# Patient Record
Sex: Female | Born: 1955 | Race: Black or African American | Hispanic: No | Marital: Married | Smoking: Never smoker
Health system: Southern US, Community
[De-identification: ages and names within clinical notes are randomized; demographics above are authoritative.]

## PROBLEM LIST (undated history)

## (undated) DIAGNOSIS — K8020 Calculus of gallbladder without cholecystitis without obstruction: Secondary | ICD-10-CM

## (undated) DIAGNOSIS — M1990 Unspecified osteoarthritis, unspecified site: Secondary | ICD-10-CM

## (undated) DIAGNOSIS — I10 Essential (primary) hypertension: Secondary | ICD-10-CM

## (undated) DIAGNOSIS — IMO0002 Reserved for concepts with insufficient information to code with codable children: Secondary | ICD-10-CM

## (undated) DIAGNOSIS — T7840XA Allergy, unspecified, initial encounter: Secondary | ICD-10-CM

## (undated) HISTORY — DX: Allergy, unspecified, initial encounter: T78.40XA

## (undated) HISTORY — PX: TUBAL LIGATION: SHX77

## (undated) HISTORY — DX: Reserved for concepts with insufficient information to code with codable children: IMO0002

## (undated) HISTORY — DX: Calculus of gallbladder without cholecystitis without obstruction: K80.20

## (undated) HISTORY — DX: Unspecified osteoarthritis, unspecified site: M19.90

---

## 1998-10-12 ENCOUNTER — Ambulatory Visit (HOSPITAL_COMMUNITY): Admission: RE | Admit: 1998-10-12 | Discharge: 1998-10-12 | Payer: Self-pay | Admitting: *Deleted

## 1998-10-12 ENCOUNTER — Encounter: Payer: Self-pay | Admitting: *Deleted

## 2010-03-07 ENCOUNTER — Other Ambulatory Visit: Admission: RE | Admit: 2010-03-07 | Discharge: 2010-03-07 | Payer: Self-pay | Admitting: Obstetrics and Gynecology

## 2010-08-23 ENCOUNTER — Emergency Department (HOSPITAL_COMMUNITY)
Admission: EM | Admit: 2010-08-23 | Discharge: 2010-08-23 | Disposition: A | Discharge status: Home / Self Care | Payer: 59 | Attending: Emergency Medicine | Admitting: Emergency Medicine

## 2010-08-23 DIAGNOSIS — R21 Rash and other nonspecific skin eruption: Secondary | ICD-10-CM | POA: Insufficient documentation

## 2010-08-23 DIAGNOSIS — T7840XA Allergy, unspecified, initial encounter: Secondary | ICD-10-CM | POA: Insufficient documentation

## 2010-08-23 DIAGNOSIS — I10 Essential (primary) hypertension: Secondary | ICD-10-CM | POA: Insufficient documentation

## 2010-08-23 LAB — URINALYSIS, ROUTINE W REFLEX MICROSCOPIC
Bilirubin Urine: NEGATIVE
Glucose, UA: NEGATIVE mg/dL
Ketones, ur: NEGATIVE mg/dL
Nitrite: NEGATIVE
Protein, ur: NEGATIVE mg/dL
Specific Gravity, Urine: 1.016 (ref 1.005–1.030)
Urobilinogen, UA: 0.2 mg/dL (ref 0.0–1.0)
pH: 6 (ref 5.0–8.0)

## 2010-08-23 LAB — URINE MICROSCOPIC-ADD ON

## 2010-10-26 ENCOUNTER — Ambulatory Visit (HOSPITAL_BASED_OUTPATIENT_CLINIC_OR_DEPARTMENT_OTHER): Payer: 59

## 2011-08-07 ENCOUNTER — Encounter (INDEPENDENT_AMBULATORY_CARE_PROVIDER_SITE_OTHER): Payer: 59 | Admitting: Obstetrics and Gynecology

## 2011-08-07 DIAGNOSIS — D259 Leiomyoma of uterus, unspecified: Secondary | ICD-10-CM

## 2011-08-07 DIAGNOSIS — R109 Unspecified abdominal pain: Secondary | ICD-10-CM

## 2011-08-15 ENCOUNTER — Encounter (INDEPENDENT_AMBULATORY_CARE_PROVIDER_SITE_OTHER): Payer: 59 | Admitting: Obstetrics and Gynecology

## 2011-08-15 ENCOUNTER — Other Ambulatory Visit (INDEPENDENT_AMBULATORY_CARE_PROVIDER_SITE_OTHER): Payer: 59

## 2011-08-15 DIAGNOSIS — N949 Unspecified condition associated with female genital organs and menstrual cycle: Secondary | ICD-10-CM

## 2011-08-15 DIAGNOSIS — D259 Leiomyoma of uterus, unspecified: Secondary | ICD-10-CM

## 2012-03-05 ENCOUNTER — Telehealth: Payer: Self-pay | Admitting: Family Medicine

## 2012-03-05 NOTE — Telephone Encounter (Signed)
Answering service call.  Identified as usual pt. of Dr. Cleta Alberts.  Complaint: chest pain. Jenna Ashley is a 56 y.o. female  Primary care: Dr. Cyndia Diver. Had visit there last week.  Didn't call their office tonight.  Having chest pain  - center of chest. Started 2 nights ago after eating.  Eased off that night.  Nausea yesterday, but no chest pain.  Yesterday evening felt ok.  Woke up with chest pain at 3am today with center of chest pain again, no dyspnea, no diaphoresis.  Had vomiting 3 times this morning. Center of chest, no radiation.  Chest pain persisted.  hx of IBS - diarrhea few times this morning.  Similar symptoms with gas pains this summer. No known hx CAD, MI, hyperlipidemia.  Discussed possible cause of reflux/gi source of pain with previous hx and current symptoms, but cannot exclude cardiac or other cause over the phone.  Advised to go to ER now for evaluation.  Husband at home and can take her. Understanding expressed.

## 2012-07-07 ENCOUNTER — Encounter (HOSPITAL_COMMUNITY): Payer: Self-pay | Admitting: Physical Medicine and Rehabilitation

## 2012-07-07 ENCOUNTER — Emergency Department (HOSPITAL_COMMUNITY)
Admission: EM | Admit: 2012-07-07 | Discharge: 2012-07-07 | Disposition: A | Discharge status: Home / Self Care | Payer: 59 | Attending: Emergency Medicine | Admitting: Emergency Medicine

## 2012-07-07 DIAGNOSIS — R110 Nausea: Secondary | ICD-10-CM | POA: Insufficient documentation

## 2012-07-07 DIAGNOSIS — E876 Hypokalemia: Secondary | ICD-10-CM

## 2012-07-07 DIAGNOSIS — R630 Anorexia: Secondary | ICD-10-CM | POA: Insufficient documentation

## 2012-07-07 DIAGNOSIS — R259 Unspecified abnormal involuntary movements: Secondary | ICD-10-CM | POA: Insufficient documentation

## 2012-07-07 DIAGNOSIS — R42 Dizziness and giddiness: Secondary | ICD-10-CM | POA: Insufficient documentation

## 2012-07-07 DIAGNOSIS — R450 Nervousness: Secondary | ICD-10-CM | POA: Insufficient documentation

## 2012-07-07 DIAGNOSIS — R109 Unspecified abdominal pain: Secondary | ICD-10-CM | POA: Insufficient documentation

## 2012-07-07 DIAGNOSIS — I10 Essential (primary) hypertension: Secondary | ICD-10-CM | POA: Insufficient documentation

## 2012-07-07 DIAGNOSIS — R5381 Other malaise: Secondary | ICD-10-CM | POA: Insufficient documentation

## 2012-07-07 DIAGNOSIS — Z79899 Other long term (current) drug therapy: Secondary | ICD-10-CM | POA: Insufficient documentation

## 2012-07-07 DIAGNOSIS — Z8719 Personal history of other diseases of the digestive system: Secondary | ICD-10-CM | POA: Insufficient documentation

## 2012-07-07 HISTORY — DX: Essential (primary) hypertension: I10

## 2012-07-07 HISTORY — DX: Calculus of gallbladder without cholecystitis without obstruction: K80.20

## 2012-07-07 LAB — URINALYSIS, ROUTINE W REFLEX MICROSCOPIC
Bilirubin Urine: NEGATIVE
Glucose, UA: NEGATIVE mg/dL
Hgb urine dipstick: NEGATIVE
Ketones, ur: NEGATIVE mg/dL
Leukocytes, UA: NEGATIVE
Nitrite: NEGATIVE
Protein, ur: NEGATIVE mg/dL
Specific Gravity, Urine: 1.007 (ref 1.005–1.030)
Urobilinogen, UA: 0.2 mg/dL (ref 0.0–1.0)
pH: 6.5 (ref 5.0–8.0)

## 2012-07-07 LAB — CBC WITH DIFFERENTIAL/PLATELET
Basophils Absolute: 0 10*3/uL (ref 0.0–0.1)
Basophils Relative: 0 % (ref 0–1)
Eosinophils Absolute: 0.3 10*3/uL (ref 0.0–0.7)
Eosinophils Relative: 4 % (ref 0–5)
HCT: 39.3 % (ref 36.0–46.0)
Hemoglobin: 12.9 g/dL (ref 12.0–15.0)
Lymphocytes Relative: 36 % (ref 12–46)
Lymphs Abs: 2.7 10*3/uL (ref 0.7–4.0)
MCH: 25.6 pg — ABNORMAL LOW (ref 26.0–34.0)
MCHC: 32.8 g/dL (ref 30.0–36.0)
MCV: 78 fL (ref 78.0–100.0)
Monocytes Absolute: 0.3 10*3/uL (ref 0.1–1.0)
Monocytes Relative: 5 % (ref 3–12)
Neutro Abs: 4.1 10*3/uL (ref 1.7–7.7)
Neutrophils Relative %: 55 % (ref 43–77)
Platelets: 310 10*3/uL (ref 150–400)
RBC: 5.04 MIL/uL (ref 3.87–5.11)
RDW: 15.6 % — ABNORMAL HIGH (ref 11.5–15.5)
WBC: 7.4 10*3/uL (ref 4.0–10.5)

## 2012-07-07 LAB — COMPREHENSIVE METABOLIC PANEL
ALT: 12 U/L (ref 0–35)
BUN: 9 mg/dL (ref 6–23)
Calcium: 10.1 mg/dL (ref 8.4–10.5)
GFR calc Af Amer: 78 mL/min — ABNORMAL LOW (ref >90)
Glucose, Bld: 93 mg/dL (ref 70–99)
Sodium: 138 mEq/L (ref 135–145)
Total Protein: 8.5 g/dL — ABNORMAL HIGH (ref 6.0–8.3)

## 2012-07-07 LAB — POCT I-STAT TROPONIN I: Troponin i, poc: 0 ng/mL (ref 0.00–0.08)

## 2012-07-07 MED ORDER — MECLIZINE HCL 50 MG PO TABS: 25.0000 mg | ORAL_TABLET | Freq: Three times a day (TID) | ORAL | Status: DC | PRN

## 2012-07-07 MED ORDER — POTASSIUM CHLORIDE CRYS ER 20 MEQ PO TBCR
40.0000 meq | EXTENDED_RELEASE_TABLET | Freq: Once | ORAL | Status: AC
  Administered 2012-07-07: 40 meq via ORAL
  Filled 2012-07-07: qty 2

## 2012-07-07 MED ORDER — ONDANSETRON HCL 4 MG/2ML IJ SOLN
4.0000 mg | Freq: Once | INTRAMUSCULAR | Status: AC
  Administered 2012-07-07: 4 mg via INTRAVENOUS
  Filled 2012-07-07: qty 2

## 2012-07-07 MED ORDER — SODIUM CHLORIDE 0.9 % IV SOLN
1000.0000 mL | Freq: Once | INTRAVENOUS | Status: AC
  Administered 2012-07-07: 1000 mL via INTRAVENOUS

## 2012-07-07 MED ORDER — SODIUM CHLORIDE 0.9 % IV SOLN: 1000.0000 mL | INTRAVENOUS | Status: DC

## 2012-07-07 MED ORDER — ONDANSETRON 8 MG PO TBDP: 8.0000 mg | ORAL_TABLET | Freq: Three times a day (TID) | ORAL | Status: DC | PRN

## 2012-07-07 NOTE — ED Notes (Signed)
Pt presents to department for evaluation of dizziness, L sided abdominal pain and generalized weakness. Ongoing x1 week. States "I feel like I'm going to pass out." pt is conscious alert and oriented x4. Able to move all extremities. 5/10 pain at the time.

## 2012-07-07 NOTE — ED Provider Notes (Signed)
History    CSN: [id number] Arrival date & time 07/07/12  1048  First MD Initiated Contact with Patient 07/07/12 1112      Chief Complaint  Patient presents with  . Nausea  . Dizziness       Patient is a 57 y.o. female presenting with neurologic complaint. The history is provided by the patient.  Neurologic Problem The primary symptoms include dizziness and nausea. Primary symptoms do not include vomiting. Episode onset: several days ago. The symptoms are unchanged. The neurological symptoms are diffuse. Context: she feels shaky and nervous.   She describes the dizziness as lightheadedness and faintness. Dizziness also occurs with nausea and weakness. Dizziness does not occur with blurred vision, tinnitus, hearing loss, vomiting or diaphoresis.  Additional symptoms include weakness. Additional symptoms do not include tinnitus. Medical issues also include hypertension.  No weakness focally.  No trouble with speech , balance or coordination.  Pt also has history of IBS.  She has had lower abdominal pain for a "while".  She state the last week or two it has been a little worse.  She feels gassy.  She denies vomiting.  She had some loose stool the other day.  No fever.  Appetite decreased.  Past Medical History  Diagnosis Date  . Gall stones   . Hypertension     Past Surgical History  Procedure Laterality Date  . Tubal ligation  35 y ago    History reviewed. No pertinent family history.  History  Substance Use Topics  . Smoking status: Never Smoker   . Smokeless tobacco: Not on file  . Alcohol Use: No    OB History   Grav Para Term Preterm Abortions TAB SAB Ect Mult Living                  Review of Systems  Constitutional: Negative for diaphoresis.  HENT: Negative for tinnitus.   Eyes: Negative for blurred vision.  Gastrointestinal: Positive for nausea. Negative for vomiting.       Some recent indigestion   Neurological: Positive for dizziness and weakness.     Allergies  Ivp dye; Sulfa antibiotics; and Sulfur  Home Medications   Current Outpatient Rx  Name  Route  Sig  Dispense  Refill  . dextromethorphan-guaiFENesin (MUCINEX DM) 30-600 MG per 12 hr tablet   Oral   Take 1 tablet by mouth every 12 (twelve) hours.         . Fluticasone-Salmeterol (ADVAIR) 100-50 MCG/DOSE AEPB   Inhalation   Inhale 1 puff into the lungs every 12 (twelve) hours.         Marland Kitchen ibuprofen (ADVIL,MOTRIN) 200 MG tablet   Oral   Take 400 mg by mouth every 6 (six) hours as needed for pain.         Marland Kitchen triamterene-hydrochlorothiazide (MAXZIDE-25) 37.5-25 MG per tablet   Oral   Take 1 tablet by mouth daily.           BP 133/86  Pulse 61  Temp(Src) 97.8 F (36.6 C) (Oral)  Resp 20  SpO2 98%  Physical Exam  Nursing note and vitals reviewed. Constitutional: She is oriented to person, place, and time. She appears well-developed and well-nourished. No distress.  HENT:  Head: Normocephalic and atraumatic.  Right Ear: External ear normal.  Left Ear: External ear normal.  Mouth/Throat: Oropharynx is clear and moist.  Eyes: Conjunctivae are normal. Right eye exhibits no discharge. Left eye exhibits no discharge. No scleral icterus.  Neck: Neck  supple. No tracheal deviation present.  Cardiovascular: Normal rate, regular rhythm and intact distal pulses.   Pulmonary/Chest: Effort normal and breath sounds normal. No stridor. No respiratory distress. She has no wheezes. She has no rales.  Abdominal: Soft. Bowel sounds are normal. She exhibits no distension. There is no tenderness. There is no rebound and no guarding.  Musculoskeletal: She exhibits no edema and no tenderness.  Neurological: She is alert and oriented to person, place, and time. She has normal strength. No cranial nerve deficit ( no gross defecits noted) or sensory deficit. She exhibits normal muscle tone. She displays no seizure activity. Coordination normal.  No pronator drift bilateral upper  extrem, able to hold both legs off bed for 5 seconds, sensation intact in all extremities, no visual field cuts, no left or right sided neglect  Skin: Skin is warm and dry. No rash noted.  Psychiatric: She has a normal mood and affect.    ED Course  Procedures (including critical care time)  Rate: 75  Rhythm: normal sinus rhythm  QRS Axis: normal  Intervals: normal  ST/T Wave abnormalities: inverted t waves anterior leads  Conduction Disutrbances:none  Narrative Interpretation: abnl  Old EKG Reviewed: none available  Labs Reviewed  CBC WITH DIFFERENTIAL - Abnormal; Notable for the following:    MCH 25.6 (*)    RDW 15.6 (*)    All other components within normal limits  COMPREHENSIVE METABOLIC PANEL - Abnormal; Notable for the following:    Potassium 3.4 (*)    Total Protein 8.5 (*)    GFR calc non Af Amer 67 (*)    GFR calc Af Amer 78 (*)    All other components within normal limits  URINALYSIS, ROUTINE W REFLEX MICROSCOPIC  POCT I-STAT TROPONIN I   No results found.   1. Nausea   2. Hypokalemia   3. Dizziness       MDM  Hypokalemia Mild. I doubt this is symptomatic. Her Maxzide is most likely the contributing factor  Dizziness Symptoms are mild. The patient is not having any neurologic complaints. Her neurologic exam is normal in the emergency room. I doubt stroke or TIA however I did discuss precautions that should have her return to emergency room. She will follow up with her doctor later this week to be rechecked. Patient has mentioned some sinus congestion. It is possible this could be related to an inner ear issue. I will give her prescription for Zofran and meclizine to see if that helps at all        Celene Kras, MD 07/07/12 1251

## 2013-03-24 ENCOUNTER — Telehealth: Payer: Self-pay | Admitting: Physician Assistant

## 2013-03-24 ENCOUNTER — Ambulatory Visit (INDEPENDENT_AMBULATORY_CARE_PROVIDER_SITE_OTHER): Payer: 59 | Admitting: Family Medicine

## 2013-03-24 VITALS — BP 130/88 | HR 82 | Temp 98.0°F | Resp 18 | Ht 65.0 in | Wt 303.6 lb

## 2013-03-24 DIAGNOSIS — J029 Acute pharyngitis, unspecified: Secondary | ICD-10-CM

## 2013-03-24 DIAGNOSIS — J209 Acute bronchitis, unspecified: Secondary | ICD-10-CM

## 2013-03-24 DIAGNOSIS — E669 Obesity, unspecified: Secondary | ICD-10-CM

## 2013-03-24 DIAGNOSIS — R42 Dizziness and giddiness: Secondary | ICD-10-CM

## 2013-03-24 MED ORDER — ALBUTEROL SULFATE HFA 108 (90 BASE) MCG/ACT IN AERS: 2.0000 | INHALATION_SPRAY | RESPIRATORY_TRACT | Status: DC | PRN

## 2013-03-24 MED ORDER — AZITHROMYCIN 250 MG PO TABS: ORAL_TABLET | ORAL | Status: DC

## 2013-03-24 MED ORDER — HYDROCODONE-HOMATROPINE 5-1.5 MG/5ML PO SYRP: 5.0000 mL | ORAL_SOLUTION | Freq: Three times a day (TID) | ORAL | Status: DC | PRN

## 2013-03-24 MED ORDER — ALBUTEROL SULFATE (2.5 MG/3ML) 0.083% IN NEBU: 2.5000 mg | INHALATION_SOLUTION | Freq: Four times a day (QID) | RESPIRATORY_TRACT | Status: DC | PRN

## 2013-03-24 NOTE — Telephone Encounter (Signed)
This patient was seen earlier today and prescribed albuterol. The prescription sent was for the nebulizer solution, rather than for the inhaler. At her request, the inhaler was sent to the pharmacy.  Meds ordered this encounter  Medications  . albuterol (PROVENTIL HFA;VENTOLIN HFA) 108 (90 BASE) MCG/ACT inhaler    Sig: Inhale 2 puffs into the lungs every 4 (four) hours as needed for wheezing or shortness of breath (cough, shortness of breath or wheezing.).    Dispense:  1 Inhaler    Refill:  1    Order Specific Question:  Supervising Provider    Answer:  DOOLITTLE, ROBERT P [[id number]]

## 2013-03-24 NOTE — Progress Notes (Signed)
Subjective:    Patient ID: Jenna Ashley, female    DOB: May 15, 1956, 57 y.o.   MRN: [id number]  HPI This chart was scribed for Kenyon Ana Lauenstein-MD, by Ladona Ridgel Digby Groeneveld, Scribe. This patient was seen in room 8 and the patient's care was started at 10:41 AM.  HPI Comments: Jenna Ashley is a 57 y.o. female who works at [employer].  Today she presents to the [facility] complaining of constant, gradually worsening productive cough, congestion and ear discomfort, onset 4 days ago. She reports associated fever especially at night time and reports sick contacts at work. She states has had an asthmatic cough before and has an expired inhaler at home but it is expired. She denies any associated SOB. She has missed several days of work b/c of her cough/congestion.  Past Medical History  Diagnosis Date  . Gall stones   . Hypertension   . Allergy   . Ulcer     Past Surgical History  Procedure Laterality Date  . Tubal ligation  41 y ago    Family History  Problem Relation Age of Onset  . Heart disease Mother   . Hypertension Mother   . Stroke Mother   . Hypertension Father   . Cancer Father   . Hypertension Sister   . Diabetes Brother   . Cancer Maternal Grandmother   . Mental illness Maternal Grandmother   . Stroke Maternal Grandmother   . Heart disease Maternal Grandfather   . Cancer Paternal Grandmother   . Cancer Paternal Grandfather   . Diabetes Brother     History   Social History  . Marital Status: Married    Spouse Name: N/A    Number of Children: N/A  . Years of Education: N/A   Occupational History  . Not on file.   Social History Main Topics  . Smoking status: Never Smoker   . Smokeless tobacco: Not on file  . Alcohol Use: No  . Drug Use: No  . Sexual Activity: Not on file   Other Topics Concern  . Not on file   Social History Narrative  . No narrative on file    Allergies  Allergen Reactions  . Ivp Dye [Iodinated Diagnostic Agents]  Anaphylaxis  . Sulfa Antibiotics Itching  . Sulfur Itching    There are no active problems to display for this patient.   Results for orders placed during the hospital encounter of 07/07/12  URINALYSIS, ROUTINE W REFLEX MICROSCOPIC      Result Value Range   Color, Urine YELLOW  YELLOW   APPearance CLEAR  CLEAR   Specific Gravity, Urine 1.007  1.005 - 1.030   pH 6.5  5.0 - 8.0   Glucose, UA NEGATIVE  NEGATIVE mg/dL   Hgb urine dipstick NEGATIVE  NEGATIVE   Bilirubin Urine NEGATIVE  NEGATIVE   Ketones, ur NEGATIVE  NEGATIVE mg/dL   Protein, ur NEGATIVE  NEGATIVE mg/dL   Urobilinogen, UA 0.2  0.0 - 1.0 mg/dL   Nitrite NEGATIVE  NEGATIVE   Leukocytes, UA NEGATIVE  NEGATIVE  CBC WITH DIFFERENTIAL      Result Value Range   WBC 7.4  4.0 - 10.5 K/uL   RBC 5.04  3.87 - 5.11 MIL/uL   Hemoglobin 12.9  12.0 - 15.0 g/dL   HCT 40.9  81.1 - 91.4 %   MCV 78.0  78.0 - 100.0 fL   MCH 25.6 (*) 26.0 - 34.0 pg   MCHC 32.8  30.0 -  36.0 g/dL   RDW 16.1 (*) 09.6 - 04.5 %   Platelets 310  150 - 400 K/uL   Neutrophils Relative % 55  43 - 77 %   Neutro Abs 4.1  1.7 - 7.7 K/uL   Lymphocytes Relative 36  12 - 46 %   Lymphs Abs 2.7  0.7 - 4.0 K/uL   Monocytes Relative 5  3 - 12 %   Monocytes Absolute 0.3  0.1 - 1.0 K/uL   Eosinophils Relative 4  0 - 5 %   Eosinophils Absolute 0.3  0.0 - 0.7 K/uL   Basophils Relative 0  0 - 1 %   Basophils Absolute 0.0  0.0 - 0.1 K/uL  COMPREHENSIVE METABOLIC PANEL      Result Value Range   Sodium 138  135 - 145 mEq/L   Potassium 3.4 (*) 3.5 - 5.1 mEq/L   Chloride 99  96 - 112 mEq/L   CO2 30  19 - 32 mEq/L   Glucose, Bld 93  70 - 99 mg/dL   BUN 9  6 - 23 mg/dL   Creatinine, Ser 4.09  0.50 - 1.10 mg/dL   Calcium 81.1  8.4 - 91.4 mg/dL   Total Protein 8.5 (*) 6.0 - 8.3 g/dL   Albumin 4.3  3.5 - 5.2 g/dL   AST 25  0 - 37 U/L   ALT 12  0 - 35 U/L   Alkaline Phosphatase 98  39 - 117 U/L   Total Bilirubin 0.5  0.3 - 1.2 mg/dL   GFR calc non Af Amer 67 (*) >90  mL/min   GFR calc Af Amer 78 (*) >90 mL/min  POCT I-STAT TROPONIN I      Result Value Range   Troponin i, poc 0.00  0.00 - 0.08 ng/mL   Comment 3             1. Acute bronchitis     No orders of the defined types were placed in this encounter.    Review of Systems  Constitutional: Positive for fever.  HENT: Positive for congestion and rhinorrhea.   Respiratory: Positive for cough. Negative for shortness of breath.   Cardiovascular: Negative for chest pain.  Gastrointestinal: Negative for vomiting.   BP 130/88  Pulse 82  Temp(Src) 98 F (36.7 C) (Oral)  Resp 18  Ht 5\' 5"  (1.651 m)  Wt 303 lb 9.6 oz (137.712 kg)  BMI 50.52 kg/m2  SpO2 100%     Objective:   Physical Exam Physical Exam  Nursing note and vitals reviewed. Constitutional: Patient is oriented to person, place, and time. Patient appears well-developed and well-nourished. No distress.  HENT:  Head: Normocephalic and atraumatic.  Neck: Neck supple. No tracheal deviation present.  Cardiovascular: Normal rate, regular rhythm and normal heart sounds.   No murmur heard. Pulmonary/Chest: Effort normal and breath sounds normal. No respiratory distress. Patient has wheezes. Patient has no rales.  Musculoskeletal: Normal range of motion.  Neurological: Patient is alert and oriented to person, place, and time.  Skin: Skin is warm and dry.  Psychiatric: Patient has a normal mood and affect. Patient's behavior is normal.       Assessment & Plan:  Acute bronchitis - Plan: HYDROcodone-homatropine (HYCODAN) 5-1.5 MG/5ML syrup, azithromycin (ZITHROMAX Z-PAK) 250 MG tablet, albuterol (PROVENTIL) (2.5 MG/3ML) 0.083% nebulizer solution  Signed, Elvina Sidle, MD

## 2013-06-17 ENCOUNTER — Other Ambulatory Visit: Payer: Self-pay | Admitting: Gastroenterology

## 2013-06-17 DIAGNOSIS — R1013 Epigastric pain: Secondary | ICD-10-CM

## 2013-06-17 DIAGNOSIS — R1310 Dysphagia, unspecified: Secondary | ICD-10-CM

## 2013-06-17 DIAGNOSIS — R110 Nausea: Secondary | ICD-10-CM

## 2013-07-07 ENCOUNTER — Ambulatory Visit (HOSPITAL_COMMUNITY)
Admission: RE | Admit: 2013-07-07 | Discharge: 2013-07-07 | Disposition: A | Discharge status: Home / Self Care | Payer: 59 | Source: Ambulatory Visit | Attending: Gastroenterology | Admitting: Gastroenterology

## 2013-07-07 DIAGNOSIS — R1013 Epigastric pain: Secondary | ICD-10-CM

## 2013-07-07 DIAGNOSIS — K8020 Calculus of gallbladder without cholecystitis without obstruction: Secondary | ICD-10-CM | POA: Insufficient documentation

## 2013-07-07 DIAGNOSIS — K224 Dyskinesia of esophagus: Secondary | ICD-10-CM | POA: Insufficient documentation

## 2013-07-07 DIAGNOSIS — R110 Nausea: Secondary | ICD-10-CM

## 2013-07-07 DIAGNOSIS — K7689 Other specified diseases of liver: Secondary | ICD-10-CM | POA: Insufficient documentation

## 2013-07-07 DIAGNOSIS — R1310 Dysphagia, unspecified: Secondary | ICD-10-CM

## 2013-07-07 DIAGNOSIS — E669 Obesity, unspecified: Secondary | ICD-10-CM | POA: Insufficient documentation

## 2013-07-07 DIAGNOSIS — R109 Unspecified abdominal pain: Secondary | ICD-10-CM | POA: Insufficient documentation

## 2013-07-09 ENCOUNTER — Encounter (INDEPENDENT_AMBULATORY_CARE_PROVIDER_SITE_OTHER): Payer: Self-pay | Admitting: Surgery

## 2013-07-28 ENCOUNTER — Ambulatory Visit (INDEPENDENT_AMBULATORY_CARE_PROVIDER_SITE_OTHER): Payer: 59 | Admitting: Surgery

## 2013-07-28 ENCOUNTER — Encounter (INDEPENDENT_AMBULATORY_CARE_PROVIDER_SITE_OTHER): Payer: Self-pay | Admitting: Surgery

## 2013-07-28 VITALS — BP 126/74 | HR 80 | Temp 97.8°F | Resp 16 | Ht 64.0 in | Wt 292.0 lb

## 2013-07-28 DIAGNOSIS — R1032 Left lower quadrant pain: Secondary | ICD-10-CM | POA: Insufficient documentation

## 2013-07-28 NOTE — Progress Notes (Signed)
Patient ID: Jenna Ashley, female   DOB: May 13, 1956, 58 y.o.   MRN: [id number]  Chief Complaint  Patient presents with  . Cholelithiasis    new pt    HPI Jenna Ashley is a 58 y.o. female.   HPI This is a pleasant female referred to me by Dr. Collene Mares for evaluation of abdominal pain. The patient has had left lower quadrant/left lower flank pain for approximately 4 years. She does have occasional nausea and has had 2 episodes of emesis. This may or may not have occurred with fatty meals. Her face concerns of her pain if that on further review seems more toward the back. She has had an extensive workup including a normal endoscopy, CAT scan, et Ronney Asters. She does have cholelithiasis and some mild esophageal dysmotility. Bowel movements are fairly normal Past Medical History  Diagnosis Date  . Gall stones   . Hypertension   . Allergy   . Ulcer     Past Surgical History  Procedure Laterality Date  . Tubal ligation  41 y ago    Family History  Problem Relation Age of Onset  . Heart disease Mother   . Hypertension Mother   . Stroke Mother   . Hypertension Father   . Cancer Father   . Hypertension Sister   . Diabetes Brother   . Cancer Maternal Grandmother   . Mental illness Maternal Grandmother   . Stroke Maternal Grandmother   . Heart disease Maternal Grandfather   . Cancer Paternal Grandmother   . Cancer Paternal Grandfather   . Diabetes Brother     Social History History  Substance Use Topics  . Smoking status: Never Smoker   . Smokeless tobacco: Not on file  . Alcohol Use: No    Allergies  Allergen Reactions  . Ivp Dye [Iodinated Diagnostic Agents] Anaphylaxis  . Sulfa Antibiotics Itching  . Sulfur Itching    Current Outpatient Prescriptions  Medication Sig Dispense Refill  . albuterol (PROVENTIL HFA;VENTOLIN HFA) 108 (90 BASE) MCG/ACT inhaler Inhale 2 puffs into the lungs every 4 (four) hours as needed for wheezing or shortness of breath (cough, shortness of  breath or wheezing.).  1 Inhaler  1  . azithromycin (ZITHROMAX Z-PAK) 250 MG tablet Take as directed on pack  6 tablet  0  . dextromethorphan-guaiFENesin (MUCINEX DM) 30-600 MG per 12 hr tablet Take 1 tablet by mouth every 12 (twelve) hours.      . Fluticasone-Salmeterol (ADVAIR) 100-50 MCG/DOSE AEPB Inhale 1 puff into the lungs every 12 (twelve) hours.      Marland Kitchen HYDROcodone-homatropine (HYCODAN) 5-1.5 MG/5ML syrup Take 5 mLs by mouth every 8 (eight) hours as needed for cough.  120 mL  0  . ibuprofen (ADVIL,MOTRIN) 200 MG tablet Take 400 mg by mouth every 6 (six) hours as needed for pain.      . meclizine (ANTIVERT) 50 MG tablet Take 0.5 tablets (25 mg total) by mouth 3 (three) times daily as needed for dizziness or nausea.  21 tablet  0  . ondansetron (ZOFRAN ODT) 8 MG disintegrating tablet Take 1 tablet (8 mg total) by mouth every 8 (eight) hours as needed for nausea.  20 tablet  0  . triamterene-hydrochlorothiazide (MAXZIDE-25) 37.5-25 MG per tablet Take 1 tablet by mouth daily.       No current facility-administered medications for this visit.    Review of Systems Review of Systems  Constitutional: Negative for fever, chills and unexpected weight change.  HENT: Negative for  congestion, hearing loss, sore throat, trouble swallowing and voice change.   Eyes: Negative for visual disturbance.  Respiratory: Negative for cough and wheezing.   Cardiovascular: Negative for chest pain, palpitations and leg swelling.  Gastrointestinal: Positive for abdominal distention. Negative for nausea, vomiting, abdominal pain, diarrhea, constipation, blood in stool and anal bleeding.  Genitourinary: Negative for hematuria, vaginal bleeding and difficulty urinating.  Musculoskeletal: Positive for arthralgias and back pain.  Skin: Negative for rash and wound.  Neurological: Negative for seizures, syncope and headaches.  Hematological: Negative for adenopathy. Does not bruise/bleed easily.   Psychiatric/Behavioral: Negative for confusion.    Blood pressure 126/74, pulse 80, temperature 97.8 F (36.6 C), temperature source Oral, resp. rate 16, height 5\' 4"  (1.626 m), weight 292 lb (132.45 kg).  Physical Exam Physical Exam  Constitutional: She is oriented to person, place, and time. She appears well-developed. No distress.  Morbidly obese female  HENT:  Head: Normocephalic and atraumatic.  Right Ear: External ear normal.  Left Ear: External ear normal.  Nose: Nose normal.  Mouth/Throat: Oropharynx is clear and moist. No oropharyngeal exudate.  Eyes: Conjunctivae are normal. Pupils are equal, round, and reactive to light. No scleral icterus.  Neck: Normal range of motion. Neck supple. No tracheal deviation present. No thyromegaly present.  Cardiovascular: Normal rate, regular rhythm, normal heart sounds and intact distal pulses.   No murmur heard. Pulmonary/Chest: Effort normal and breath sounds normal. No respiratory distress. She has no wheezes. She has no rales.  Abdominal: Soft. Bowel sounds are normal. She exhibits no distension. There is no tenderness. There is no rebound and no guarding.  Musculoskeletal: Normal range of motion. She exhibits no edema and no tenderness.  Neurological: She is alert and oriented to person, place, and time.  Skin: Skin is warm and dry. No rash noted. She is not diaphoretic. No erythema.  Psychiatric: Her behavior is normal. Judgment normal.    Data Reviewed I reviewed her data. She does have an ultrasound showing gallstones but no other abnormalities  Assessment    Abdominal pain of uncertain etiology     Plan    Certainly, her nausea can be the result of her cholelithiasis but I cannot relate this to her back and flank pain the left side which is her biggest concern. It is difficult to tell, but this may be either neurologic or orthopedic in nature. I do not believe a laparoscopic cholecystectomy will solve these issues. I did  recommend that she gets refer to an orthopedic surgeon for further evaluation. Should she develop any symptoms from her gallstones or worsening nausea, I'll see her back and we will readdress performing a laparoscopic cholecystectomy        June Rode A 07/28/2013, 3:53 PM

## 2013-08-07 ENCOUNTER — Encounter (INDEPENDENT_AMBULATORY_CARE_PROVIDER_SITE_OTHER): Payer: Self-pay

## 2014-11-26 ENCOUNTER — Ambulatory Visit (INDEPENDENT_AMBULATORY_CARE_PROVIDER_SITE_OTHER): Payer: 59 | Admitting: Family Medicine

## 2014-11-26 ENCOUNTER — Other Ambulatory Visit: Payer: Self-pay | Admitting: Family Medicine

## 2014-11-26 VITALS — BP 122/72 | HR 93 | Temp 98.5°F | Resp 16 | Ht 64.0 in | Wt 290.6 lb

## 2014-11-26 DIAGNOSIS — R109 Unspecified abdominal pain: Secondary | ICD-10-CM | POA: Diagnosis not present

## 2014-11-26 DIAGNOSIS — L299 Pruritus, unspecified: Secondary | ICD-10-CM

## 2014-11-26 DIAGNOSIS — R309 Painful micturition, unspecified: Secondary | ICD-10-CM | POA: Diagnosis not present

## 2014-11-26 DIAGNOSIS — D509 Iron deficiency anemia, unspecified: Secondary | ICD-10-CM

## 2014-11-26 LAB — POCT UA - MICROSCOPIC ONLY
CASTS, UR, LPF, POC: NEGATIVE
CRYSTALS, UR, HPF, POC: NEGATIVE
Mucus, UA: NEGATIVE
YEAST UA: NEGATIVE

## 2014-11-26 LAB — POCT CBC
GRANULOCYTE PERCENT: 66 % (ref 37–80)
HCT, POC: 36.8 % — AB (ref 37.7–47.9)
Hemoglobin: 11.8 g/dL — AB (ref 12.2–16.2)
LYMPH, POC: 2.6 (ref 0.6–3.4)
MCH: 25 pg — AB (ref 27–31.2)
MCHC: 32.2 g/dL (ref 31.8–35.4)
MCV: 77.6 fL — AB (ref 80–97)
MID (CBC): 0.4 (ref 0–0.9)
MPV: 7.7 fL (ref 0–99.8)
PLATELET COUNT, POC: 334 10*3/uL (ref 142–424)
POC Granulocyte: 5.8 (ref 2–6.9)
POC LYMPH %: 29.2 % (ref 10–50)
POC MID %: 4.8 %M (ref 0–12)
RBC: 4.75 M/uL (ref 4.04–5.48)
RDW, POC: 16.4 %
WBC: 8.8 10*3/uL (ref 4.6–10.2)

## 2014-11-26 LAB — POCT URINALYSIS DIPSTICK
BILIRUBIN UA: NEGATIVE
Glucose, UA: NEGATIVE
Ketones, UA: NEGATIVE
NITRITE UA: NEGATIVE
PH UA: 6
PROTEIN UA: NEGATIVE
Spec Grav, UA: 1.02
UROBILINOGEN UA: 0.2

## 2014-11-26 MED ORDER — CIPROFLOXACIN HCL 500 MG PO TABS: 500.0000 mg | ORAL_TABLET | Freq: Two times a day (BID) | ORAL | Status: DC

## 2014-11-26 NOTE — Patient Instructions (Signed)
We are going to treat you for a possible UTI while we await the rest of your labs I will give you a call when your labs come in Let me know if you are not feeling better in the next couple of days- Sooner if worse.   Avoid fatty foods while we are figuring out this potential gallbladder concern.

## 2014-11-26 NOTE — Progress Notes (Addendum)
[facility] [address], [place] [address] [phone]  Date:  11/26/2014   Name:  Jenna Ashley   DOB:  [birth year]   MRN:  [id number]  PCP:  Nydia Bouton, MD    Chief Complaint: Back Pain; other; Dysuria; and Pruritis   History of Present Illness:  Jenna Ashley is a 59 y.o. very pleasant female patient who presents with the following:  Here today with a few concerns Last year she was dx with gallstones- she consulted with a surgeon but they did not think she needed a chole at that time as her pain was more on the left, they were not sure if it was related to her stones.    Today is Thursday- on Monday she noted recurrent left flank pain. This has waxed and waned over the last several days.  Last night she was having the same pain, and noted dysuria.  She has not noted any hematuria  She has also noted some generalized itching on her body for about 3 weeks,  She is using benadryl as needed.  She will get some hives at times- these come and go Also using aleve currently for her pain.    No known history of kidney stones. She has had a UTI in the past and this "sort of" reminds her of this time When she was dx with the gallstones she had nausea but not so much pain.   She has noted a little bit of nausea.  No vomiting with this episode.  Slight diarrhea.   She is eating normally but has been watching her diet to help her gallbladder.  Trying to avoid fatty foods Since she was dx with stones she has noted occasional sx after fatty meals  [place] 06/2013: IMPRESSION: 1. Cholelithiasis without evidence of cholecystitis. Further evaluation could be performed with HIDA scan (possibly with CCK augmentation) as clinically indicated. 2. Hepatic steatosis.  Patient Active Problem List   Diagnosis Date Noted  . Abdominal pain, left lower quadrant 07/28/2013  . Obesity, unspecified 03/24/2013    Past Medical History  Diagnosis Date  . Gall stones   .  Hypertension   . Allergy   . Ulcer   . Arthritis   . Gallstones     Past Surgical History  Procedure Laterality Date  . Tubal ligation  35 y ago    History  Substance Use Topics  . Smoking status: Never Smoker   . Smokeless tobacco: Not on file  . Alcohol Use: No    Family History  Problem Relation Age of Onset  . Heart disease Mother   . Hypertension Mother   . Stroke Mother   . Hypertension Father   . Cancer Father   . Hypertension Sister   . Diabetes Brother   . Cancer Maternal Grandmother   . Mental illness Maternal Grandmother   . Stroke Maternal Grandmother   . Heart disease Maternal Grandfather   . Cancer Paternal Grandmother   . Cancer Paternal Grandfather   . Diabetes Brother     Allergies  Allergen Reactions  . Ivp Dye [Iodinated Diagnostic Agents] Anaphylaxis  . Sulfa Antibiotics Itching  . Sulfur Itching    Medication list has been reviewed and updated.  Current Outpatient Prescriptions on File Prior to Visit  Medication Sig Dispense Refill  . triamterene-hydrochlorothiazide (MAXZIDE-25) 37.5-25 MG per tablet Take 1 tablet by mouth daily.    Marland Kitchen albuterol (PROVENTIL HFA;VENTOLIN HFA) 108 (90 BASE) MCG/ACT  inhaler Inhale 2 puffs into the lungs every 4 (four) hours as needed for wheezing or shortness of breath (cough, shortness of breath or wheezing.). (Patient not taking: Reported on 11/26/2014) 1 Inhaler 1  . azithromycin (ZITHROMAX Z-PAK) 250 MG tablet Take as directed on pack (Patient not taking: Reported on 11/26/2014) 6 tablet 0  . dextromethorphan-guaiFENesin (MUCINEX DM) 30-600 MG per 12 hr tablet Take 1 tablet by mouth every 12 (twelve) hours.    . Fluticasone-Salmeterol (ADVAIR) 100-50 MCG/DOSE AEPB Inhale 1 puff into the lungs every 12 (twelve) hours.    Marland Kitchen HYDROcodone-homatropine (HYCODAN) 5-1.5 MG/5ML syrup Take 5 mLs by mouth every 8 (eight) hours as needed for cough. (Patient not taking: Reported on 11/26/2014) 120 mL 0  . ibuprofen  (ADVIL,MOTRIN) 200 MG tablet Take 400 mg by mouth every 6 (six) hours as needed for pain.    . meclizine (ANTIVERT) 50 MG tablet Take 0.5 tablets (25 mg total) by mouth 3 (three) times daily as needed for dizziness or nausea. (Patient not taking: Reported on 11/26/2014) 21 tablet 0  . ondansetron (ZOFRAN ODT) 8 MG disintegrating tablet Take 1 tablet (8 mg total) by mouth every 8 (eight) hours as needed for nausea. (Patient not taking: Reported on 11/26/2014) 20 tablet 0   No current facility-administered medications on file prior to visit.    Review of Systems:  As per HPI- otherwise negative.   Physical Examination: Filed Vitals:   11/26/14 1538  BP: 122/72  Pulse: 93  Temp: 98.5 F (36.9 C)  Resp: 16   Filed Vitals:   11/26/14 1538  Height: 5\' 4"  (1.626 m)  Weight: 290 lb 9.6 oz (131.815 kg)   Body mass index is 49.86 kg/(m^2). Ideal Body Weight: Weight in (lb) to have BMI = 25: 145.3  GEN: WDWN, NAD, Non-toxic, A & O x 3, obese, looks well HEENT: Atraumatic, Normocephalic. Neck supple. No masses, No LAD.  Bilateral TM wnl, oropharynx normal.  PEERL,EOMI.   Ears and Nose: No external deformity. CV: RRR, No M/G/R. No JVD. No thrill. No extra heart sounds. PULM: CTA B, no wheezes, crackles, rhonchi. No retractions. No resp. distress. No accessory muscle use. ABD: S, ND, +BS. No rebound. No HSM.  She has mild LUQ tenderness  EXTR: No c/c/e NEURO Normal gait.  PSYCH: Normally interactive. Conversant. Not depressed or anxious appearing.  Calm demeanor.  No rash currently    Assessment and Plan: Left flank pain - Plan: POCT CBC, Comprehensive metabolic panel, Lipase, Amylase  Painful urination - Plan: POCT UA - Microscopic Only, POCT urinalysis dipstick, Urine culture, ciprofloxacin (CIPRO) 500 MG tablet  Itching - Plan: Comprehensive metabolic panel  Treat for possible UTI/ pyelo considering her flank pain with cipro Await the rest of her labs and will follow-up - also will  need to address her anemia, likely iron deficiency At this time her belly is certainly not acute   Signed Lamar Blinks, MD  OK to LM on her phone  Called with the rest of her labs 7/17 and [facility]- labs ok so far but it looks like she did have a UTI.  Continue cipro.  Also she likely has iron deficiently anemia.  Will add on a ferritin and get back with her  7/24: received her ferritin. It does appear that she has iron def anemia. Will start on an iron supplement and plan to recheck CBC in about 3 months. Take iron qd or BID as tolerated.   She did have a colonoscopy  about 7 years ago No other cause of her itching noted- she will try zantac and claritin, let me know if not improved soon Lab Results  Component Value Date   FERRITIN 34 11/26/2014    Results for orders placed or performed in visit on 11/26/14  Urine culture  Result Value Ref Range   Colony Count >=100,000 COLONIES/ML    Preliminary Report ESCHERICHIA COLI   Comprehensive metabolic panel  Result Value Ref Range   Sodium 140 135 - 145 mEq/L   Potassium 4.0 3.5 - 5.3 mEq/L   Chloride 99 96 - 112 mEq/L   CO2 28 19 - 32 mEq/L   Glucose, Bld 88 70 - 99 mg/dL   BUN 18 6 - 23 mg/dL   Creat 1.25 (H) 0.50 - 1.10 mg/dL   Total Bilirubin 0.6 0.2 - 1.2 mg/dL   Alkaline Phosphatase 82 39 - 117 U/L   AST 24 0 - 37 U/L   ALT 13 0 - 35 U/L   Total Protein 7.9 6.0 - 8.3 g/dL   Albumin 4.4 3.5 - 5.2 g/dL   Calcium 9.8 8.4 - 10.5 mg/dL  Lipase  Result Value Ref Range   Lipase 18 0 - 75 U/L  Amylase  Result Value Ref Range   Amylase 57 0 - 105 U/L  POCT UA - Microscopic Only  Result Value Ref Range   WBC, Ur, HPF, POC 2-4    RBC, urine, microscopic 0-3    Bacteria, U Microscopic few    Mucus, UA neg    Epithelial cells, urine per micros 1-6    Crystals, Ur, HPF, POC neg    Casts, Ur, LPF, POC neg    Yeast, UA neg   POCT urinalysis dipstick  Result Value Ref Range   Color, UA yellow    Clarity, UA clear    Glucose,  UA neg    Bilirubin, UA neg    Ketones, UA neg    Spec Grav, UA 1.020    Blood, UA small    pH, UA 6.0    Protein, UA neg    Urobilinogen, UA 0.2    Nitrite, UA neg    Leukocytes, UA small (1+) (A) Negative  POCT CBC  Result Value Ref Range   WBC 8.8 4.6 - 10.2 K/uL   Lymph, poc 2.6 0.6 - 3.4   POC LYMPH PERCENT 29.2 10 - 50 %L   MID (cbc) 0.4 0 - 0.9   POC MID % 4.8 0 - 12 %M   POC Granulocyte 5.8 2 - 6.9   Granulocyte percent 66.0 37 - 80 %G   RBC 4.75 4.04 - 5.48 M/uL   Hemoglobin 11.8 (A) 12.2 - 16.2 g/dL   HCT, POC 36.8 (A) 37.7 - 47.9 %   MCV 77.6 (A) 80 - 97 fL   MCH, POC 25.0 (A) 27 - 31.2 pg   MCHC 32.2 31.8 - 35.4 g/dL   RDW, POC 16.4 %   Platelet Count, POC 334 142 - 424 K/uL   MPV 7.7 0 - 99.8 fL

## 2014-11-27 LAB — COMPREHENSIVE METABOLIC PANEL
ALBUMIN: 4.4 g/dL (ref 3.5–5.2)
ALK PHOS: 82 U/L (ref 39–117)
ALT: 13 U/L (ref 0–35)
AST: 24 U/L (ref 0–37)
BUN: 18 mg/dL (ref 6–23)
CHLORIDE: 99 meq/L (ref 96–112)
CO2: 28 meq/L (ref 19–32)
Calcium: 9.8 mg/dL (ref 8.4–10.5)
Creat: 1.25 mg/dL — ABNORMAL HIGH (ref 0.50–1.10)
GLUCOSE: 88 mg/dL (ref 70–99)
POTASSIUM: 4 meq/L (ref 3.5–5.3)
Sodium: 140 mEq/L (ref 135–145)
TOTAL PROTEIN: 7.9 g/dL (ref 6.0–8.3)
Total Bilirubin: 0.6 mg/dL (ref 0.2–1.2)

## 2014-11-27 LAB — LIPASE: LIPASE: 18 U/L (ref 0–75)

## 2014-11-27 LAB — AMYLASE: Amylase: 57 U/L (ref 0–105)

## 2014-11-29 LAB — URINE CULTURE

## 2014-12-02 LAB — FERRITIN: Ferritin: 34 ng/mL (ref 10–291)

## 2014-12-06 ENCOUNTER — Encounter: Payer: Self-pay | Admitting: Family Medicine

## 2014-12-06 MED ORDER — FERROUS SULFATE 325 (65 FE) MG PO TABS
ORAL_TABLET | ORAL | Status: DC
Start: 2014-12-06 — End: 2023-12-25

## 2014-12-06 NOTE — Addendum Note (Signed)
Addended by: Lamar Blinks C on: 12/06/2014 07:19 PM   Modules accepted: Orders

## 2015-02-17 ENCOUNTER — Ambulatory Visit (INDEPENDENT_AMBULATORY_CARE_PROVIDER_SITE_OTHER): Payer: 59 | Admitting: Family Medicine

## 2015-02-17 ENCOUNTER — Encounter: Payer: Self-pay | Admitting: Family Medicine

## 2015-02-17 ENCOUNTER — Ambulatory Visit: Payer: 59

## 2015-02-17 VITALS — BP 123/94 | HR 97 | Temp 97.5°F | Resp 16 | Wt 289.0 lb

## 2015-02-17 DIAGNOSIS — I10 Essential (primary) hypertension: Secondary | ICD-10-CM

## 2015-02-17 DIAGNOSIS — R0781 Pleurodynia: Secondary | ICD-10-CM

## 2015-02-17 DIAGNOSIS — D509 Iron deficiency anemia, unspecified: Secondary | ICD-10-CM | POA: Diagnosis not present

## 2015-02-17 DIAGNOSIS — Z119 Encounter for screening for infectious and parasitic diseases, unspecified: Secondary | ICD-10-CM | POA: Diagnosis not present

## 2015-02-17 DIAGNOSIS — Z1211 Encounter for screening for malignant neoplasm of colon: Secondary | ICD-10-CM

## 2015-02-17 NOTE — Progress Notes (Addendum)
[facility] [address], [place] [address] [phone]  Date:  02/17/2015   Name:  Jenna Ashley   DOB:  06-16-1955   MRN:  [id number]  PCP:  Nydia Bouton, MD    Chief Complaint: Anemia and wants to use you as PCP   History of Present Illness:  Jenna Ashley is a 59 y.o. very pleasant female patient who presents with the following:  Last seen here in July with a UTI/ pyelo.  Also noted that she had iron deficiency (microcytic) anemia with ferritin of 34, hg 11.8. Here today to check on her anemia.   She is taking iron twice a day and is tolerating it ok. It does cause some nausea Colonoscopy was about 7 years ago- she was asked to have repeat in 5 years.  This was done by Dr. Marin Comment in [place], [place] but she would like to see someone in [place] as she is now living here  No menses in about 15 years  They do her mammograms through work, flu shot done this year already  She is fasting today Also notes that she continues to have left flank pain for 4-5 years, "it comes and goes."  We had thought this pain was due to kidney infection, was not aware that it was so longstanding.   She does have a history of gallstones.  However general surgery did not want to remove her GB as the pain was on the LEFT side.  The pain is not really changing.  No particular pattern to her pain She has not had any x-rays of the painful area that she can recall  Normal CMP with amylase/ lipase 11/2014  Patient Active Problem List   Diagnosis Date Noted  . Abdominal pain, left lower quadrant 07/28/2013  . Obesity, unspecified 03/24/2013    Past Medical History  Diagnosis Date  . Gall stones   . Hypertension   . Allergy   . Ulcer   . Arthritis   . Gallstones     Past Surgical History  Procedure Laterality Date  . Tubal ligation  14 y ago    Social History  Substance Use Topics  . Smoking status: Never Smoker   . Smokeless tobacco: None  . Alcohol Use: No    Family  History  Problem Relation Age of Onset  . Heart disease Mother   . Hypertension Mother   . Stroke Mother   . Hypertension Father   . Cancer Father   . Hypertension Sister   . Diabetes Brother   . Cancer Maternal Grandmother   . Mental illness Maternal Grandmother   . Stroke Maternal Grandmother   . Heart disease Maternal Grandfather   . Cancer Paternal Grandmother   . Cancer Paternal Grandfather   . Diabetes Brother     Allergies  Allergen Reactions  . Ivp Dye [Iodinated Diagnostic Agents] Anaphylaxis  . Sulfa Antibiotics Itching  . Sulfur Itching    Medication list has been reviewed and updated.  Current Outpatient Prescriptions on File Prior to Visit  Medication Sig Dispense Refill  . dextromethorphan-guaiFENesin (MUCINEX DM) 30-600 MG per 12 hr tablet Take 1 tablet by mouth every 12 (twelve) hours.    . ferrous sulfate 325 (65 FE) MG tablet Take once or twice a day as tolerated 60 tablet 3  . ibuprofen (ADVIL,MOTRIN) 200 MG tablet Take 400 mg by mouth every 6 (six) hours as needed for pain.    Marland Kitchen triamterene-hydrochlorothiazide (  MAXZIDE-25) 37.5-25 MG per tablet Take 1 tablet by mouth daily.    . Fluticasone-Salmeterol (ADVAIR) 100-50 MCG/DOSE AEPB Inhale 1 puff into the lungs every 12 (twelve) hours.     No current facility-administered medications on file prior to visit.    Review of Systems:  As per HPI- otherwise negative.   Physical Examination: Filed Vitals:   02/17/15 1031  BP: 123/94  Pulse: 97  Temp: 97.5 F (36.4 C)  Resp: 16   Filed Vitals:   02/17/15 1031  Weight: 289 lb (131.09 kg)   Body mass index is 49.58 kg/(m^2). Ideal Body Weight:    GEN: WDWN, NAD, Non-toxic, A & O x 3, very obese, looks well HEENT: Atraumatic, Normocephalic. Neck supple. No masses, No LAD. Ears and Nose: No external deformity. CV: RRR, No M/G/R. No JVD. No thrill. No extra heart sounds. PULM: CTA B, no wheezes, crackles, rhonchi. No retractions. No resp. distress.  No accessory muscle use. ABD: obese belly limits exam, S, NT, ND. No rebound. No HSM.  Tenderness in left ribs EXTR: No c/c/e NEURO Normal gait.  PSYCH: Normally interactive. Conversant. Not depressed or anxious appearing.  Calm demeanor.   Unfortunately our x-ray machine is down today Assessment and Plan: Anemia, iron deficiency - Plan: Ferritin, CBC  Rib pain on left side - Plan: CANCELED: DG Chest 2 View, CANCELED: DG Ribs Unilateral Left  Morbid obesity due to excess calories ([place]) - Plan: Lipid panel  Essential hypertension  Screening for colon cancer - Plan: Ambulatory referral to Gastroenterology  Screening examination for infectious disease - Plan: Hepatitis C antibody  Recheck CBC and ferritin today- hope to be able to stop her iron as it exacerbates nausea Check other labs as above Referral to GI for colonoscopy Will plan further follow- up pending labs. She will come back for a follow-up in one month and we can do her x-rays then   Signed Lamar Blinks, MD

## 2015-02-17 NOTE — Patient Instructions (Signed)
I will be in touch with your labs asap, and will refer you to a GI doctor for possible repeat colonoscopy Please see me in about one month for a recheck- we will plan to do your x-rays then. Sorry that we were not able to do this for you today!

## 2015-02-18 ENCOUNTER — Other Ambulatory Visit: Payer: 59

## 2015-02-18 LAB — LIPID PANEL
Cholesterol: 210 mg/dL — ABNORMAL HIGH (ref 125–200)
HDL: 79 mg/dL (ref >=46)
LDL CALC: 121 mg/dL (ref <130)
TRIGLYCERIDES: 52 mg/dL (ref <150)
Total CHOL/HDL Ratio: 2.7 Ratio (ref <=5.0)
VLDL: 10 mg/dL (ref <30)

## 2015-02-18 LAB — CBC
HCT: 38.1 % (ref 36.0–46.0)
Hemoglobin: 12.2 g/dL (ref 12.0–15.0)
MCH: 25.5 pg — ABNORMAL LOW (ref 26.0–34.0)
MCHC: 32 g/dL (ref 30.0–36.0)
MCV: 79.5 fL (ref 78.0–100.0)
MPV: 9.9 fL (ref 8.6–12.4)
PLATELETS: 323 10*3/uL (ref 150–400)
RBC: 4.79 MIL/uL (ref 3.87–5.11)
RDW: 15.4 % (ref 11.5–15.5)
WBC: 7.9 10*3/uL (ref 4.0–10.5)

## 2015-02-18 LAB — FERRITIN: Ferritin: 52 ng/mL (ref 10–291)

## 2015-02-19 ENCOUNTER — Encounter: Payer: Self-pay | Admitting: Family Medicine

## 2015-02-19 LAB — HEPATITIS C ANTIBODY: HCV AB: NEGATIVE

## 2015-03-24 ENCOUNTER — Ambulatory Visit (INDEPENDENT_AMBULATORY_CARE_PROVIDER_SITE_OTHER): Payer: 59 | Admitting: Family Medicine

## 2015-03-24 ENCOUNTER — Encounter: Payer: Self-pay | Admitting: Family Medicine

## 2015-03-24 ENCOUNTER — Ambulatory Visit (INDEPENDENT_AMBULATORY_CARE_PROVIDER_SITE_OTHER): Payer: 59

## 2015-03-24 VITALS — BP 120/90 | HR 94 | Temp 97.9°F | Resp 16 | Ht 64.25 in | Wt 291.2 lb

## 2015-03-24 DIAGNOSIS — R109 Unspecified abdominal pain: Secondary | ICD-10-CM

## 2015-03-24 DIAGNOSIS — G609 Hereditary and idiopathic neuropathy, unspecified: Secondary | ICD-10-CM | POA: Diagnosis not present

## 2015-03-24 DIAGNOSIS — I10 Essential (primary) hypertension: Secondary | ICD-10-CM | POA: Diagnosis not present

## 2015-03-24 MED ORDER — PREGABALIN 75 MG PO CAPS: 75.0000 mg | ORAL_CAPSULE | Freq: Two times a day (BID) | ORAL | Status: DC

## 2015-03-24 MED ORDER — TRIAMTERENE-HCTZ 37.5-25 MG PO TABS: 1.0000 | ORAL_TABLET | Freq: Every day | ORAL | Status: DC

## 2015-03-24 NOTE — Patient Instructions (Addendum)
Please call [facility] and schedule a colonoscopy appointment- if you need anything further as far as a referral let me know!   Address: [place], [place], [place] [address] Phone: [phone]  Try the lyrica twice a day for your pain- I hope that this may help you Let me know if the pain changes or gets worse!

## 2015-03-24 NOTE — Progress Notes (Signed)
[facility] [address], [place] [address] [phone]  Date:  03/24/2015   Name:  JASMYN Ashley   DOB:  13-Jul-1955   MRN:  [id number]  PCP:  Nydia Bouton, MD    Chief Complaint: Follow-up; left side pain; and referral for colonoscopy   History of Present Illness:  Jenna Ashley is a 59 y.o. very pleasant female patient who presents with the following:  She is here today to do her x-rays that we had planned at our last visit- she has had trouble with left flank pain of unknown origin.   At her last visit our x-ray machine was down so we were not able to do her films.    She has had this left side pain for 3-5 years.  It is there 40-50% of the time.  She has had an extensive work- up including CT scan, GI evaluation, and general surgery consultation.  No cause for her pain was ever found.  It does not seem to be getting worse or changing  Patient Active Problem List   Diagnosis Date Noted  . Abdominal pain, left lower quadrant 07/28/2013  . Obesity, unspecified 03/24/2013    Past Medical History  Diagnosis Date  . Gall stones   . Hypertension   . Allergy   . Ulcer   . Arthritis   . Gallstones     Past Surgical History  Procedure Laterality Date  . Tubal ligation  91 y ago    Social History  Substance Use Topics  . Smoking status: Never Smoker   . Smokeless tobacco: None  . Alcohol Use: No    Family History  Problem Relation Age of Onset  . Heart disease Mother   . Hypertension Mother   . Stroke Mother   . Hypertension Father   . Cancer Father   . Hypertension Sister   . Diabetes Brother   . Cancer Maternal Grandmother   . Mental illness Maternal Grandmother   . Stroke Maternal Grandmother   . Heart disease Maternal Grandfather   . Cancer Paternal Grandmother   . Cancer Paternal Grandfather   . Diabetes Brother     Allergies  Allergen Reactions  . Ivp Dye [Iodinated Diagnostic Agents] Anaphylaxis  . Sulfa Antibiotics  Itching  . Sulfur Itching    Medication list has been reviewed and updated.  Current Outpatient Prescriptions on File Prior to Visit  Medication Sig Dispense Refill  . dextromethorphan-guaiFENesin (MUCINEX DM) 30-600 MG per 12 hr tablet Take 1 tablet by mouth every 12 (twelve) hours.    . Fluticasone-Salmeterol (ADVAIR) 100-50 MCG/DOSE AEPB Inhale 1 puff into the lungs every 12 (twelve) hours.    Marland Kitchen ibuprofen (ADVIL,MOTRIN) 200 MG tablet Take 400 mg by mouth every 6 (six) hours as needed for pain.    Marland Kitchen triamterene-hydrochlorothiazide (MAXZIDE-25) 37.5-25 MG per tablet Take 1 tablet by mouth daily.    . ferrous sulfate 325 (65 FE) MG tablet Take once or twice a day as tolerated (Patient not taking: Reported on 03/24/2015) 60 tablet 3   No current facility-administered medications on file prior to visit.    Review of Systems:  As per HPI- otherwise negative.   Physical Examination: Filed Vitals:   03/24/15 1538  BP: 120/90  Pulse: 94  Temp: 97.9 F (36.6 C)  Resp: 16   Filed Vitals:   03/24/15 1538  Height: 5' 4.25" (1.632 m)  Weight: 291 lb 3.2 oz (132.087 kg)  Body mass index is 49.59 kg/(m^2). Ideal Body Weight: Weight in (lb) to have BMI = 25: 146.5  GEN: WDWN, NAD, Non-toxic, A & O x 3, obese, looks well HEENT: Atraumatic, Normocephalic. Neck supple. No masses, No LAD. Ears and Nose: No external deformity. CV: RRR, No M/G/R. No JVD. No thrill. No extra heart sounds. PULM: CTA B, no wheezes, crackles, rhonchi. No retractions. No resp. distress. No accessory muscle use. ABD: S, NT, ND, she notes that the area of concern is over her left lateral ribs.  This is reproducible by pressing on her ribs EXTR: No c/c/e NEURO Normal gait.  PSYCH: Normally interactive. Conversant. Not depressed or anxious appearing.  Calm demeanor.   [facility] reading (PRIMARY) by  Dr. Lorelei Pont. CXR/ left ribs: negative LEFT RIBS AND CHEST - 3+ VIEW  COMPARISON: None.  FINDINGS: No fracture or  other bone lesions are seen involving the ribs. There is no evidence of pneumothorax or pleural effusion. Both lungs are clear. Heart size and mediastinal contours are within normal limits.  IMPRESSION: No acute abnormality noted.  Assessment and Plan: Left flank pain - Plan: DG Ribs Unilateral W/Chest Left, pregabalin (LYRICA) 75 MG capsule  Essential hypertension - Plan: triamterene-hydrochlorothiazide (MAXZIDE-25) 37.5-25 MG tablet  Hereditary and idiopathic peripheral neuropathy - Plan: pregabalin (LYRICA) 75 MG capsule  Here today to evaluate long- standing left side pain.  We do not have a cause for this pain but we do feel assured that it is not anything dangerous.  We will try lyrica to see if this may help- she will keep me posted Refilled her BP medication as well

## 2015-04-06 ENCOUNTER — Telehealth: Payer: Self-pay | Admitting: Family Medicine

## 2015-04-06 NOTE — Telephone Encounter (Signed)
Called her back- this is a different issue than I have been treating her for. She does need to come in for evaluation,  She states understanding

## 2015-04-06 NOTE — Telephone Encounter (Signed)
Patient states that she is having symptoms of nausea. She states that years ago, Dr. Collene Mares gave her a medication to help ease her nausea. She does not know the name of the medicine. Explained to the patient that she may have to come in for an OV in order to have this prescribed. She states that she was seen on 03/24/2015 and had an x-ray on her left side. She also mentioned a history of gallstones.   [phone]

## 2015-04-13 ENCOUNTER — Telehealth: Payer: Self-pay

## 2015-04-13 DIAGNOSIS — M792 Neuralgia and neuritis, unspecified: Secondary | ICD-10-CM

## 2015-04-13 NOTE — Telephone Encounter (Signed)
PA is required for Lyrica. PA form states that it requires step therapy which means that pt has to have tried/failed gabapentin first (or have a contraindication to taking it). Dr Lorelei Pont, I don't see any hx of trial of gabapentin. Do you want to Rx it instead?

## 2015-04-14 MED ORDER — GABAPENTIN 300 MG PO CAPS: 300.0000 mg | ORAL_CAPSULE | Freq: Two times a day (BID) | ORAL | Status: DC

## 2015-04-14 NOTE — Telephone Encounter (Signed)
Called to discuss with her- she is ok with trying gabapentin instead.   Creat clearance is 50- she can take up to 700 mg BID.  Will start on the 300 BID.  She will keep me posted   Meds ordered this encounter  Medications  . gabapentin (NEURONTIN) 300 MG capsule    Sig: Take 1 capsule (300 mg total) by mouth 2 (two) times daily. Take just 1 pill the first day of treatment    Dispense:  90 capsule    Refill:  3

## 2015-09-06 IMAGING — US US ABDOMEN COMPLETE
1 series · 13 of 25 positions shown · non-contrast
Comparison: CT ABD/PELWITH dated 07/13/2010 (Report only, no images)

CLINICAL DATA: Abdominal pain, obesity, nausea

EXAM:
ULTRASOUND ABDOMEN COMPLETE

[Series 1: us abdomen complete · 0.26mm/px · 13 of 79 slices shown]
[im 1/79]
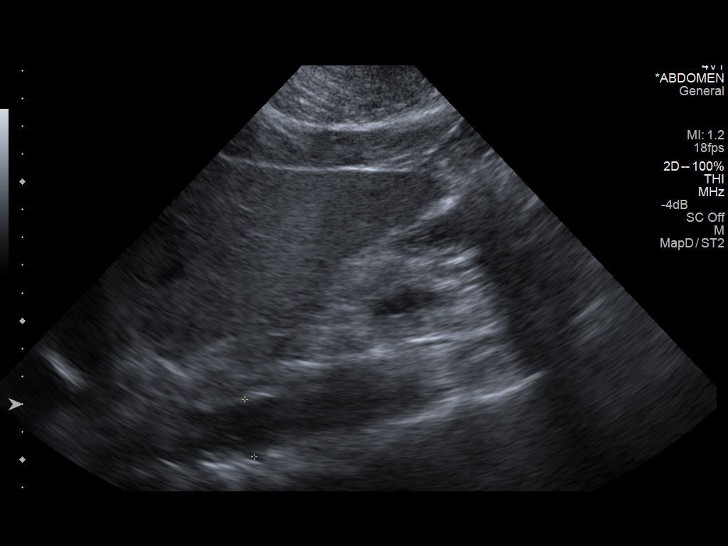
[im 7/79]
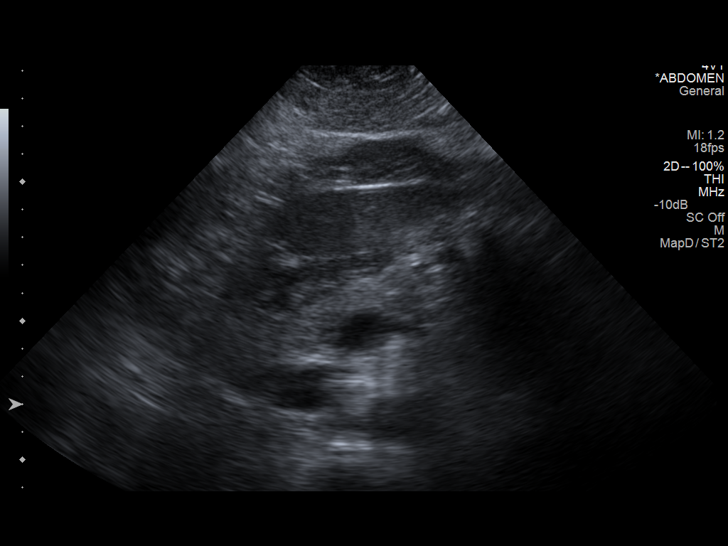
[im 14/79]
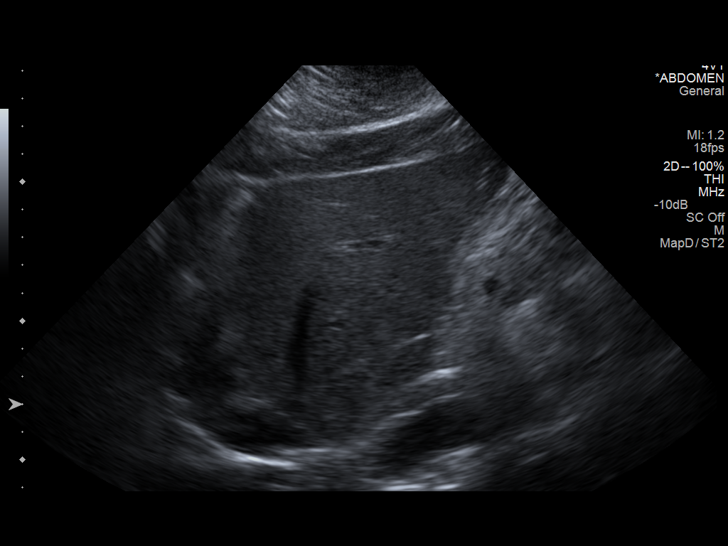
[im 20/79]
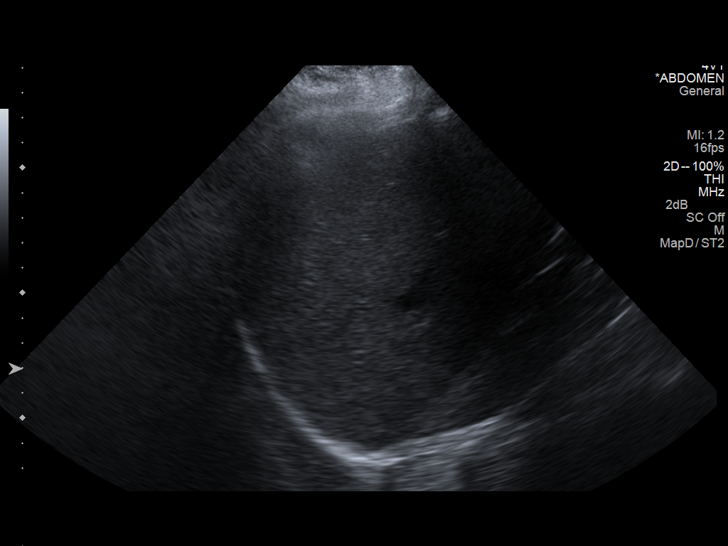
[im 27/79]
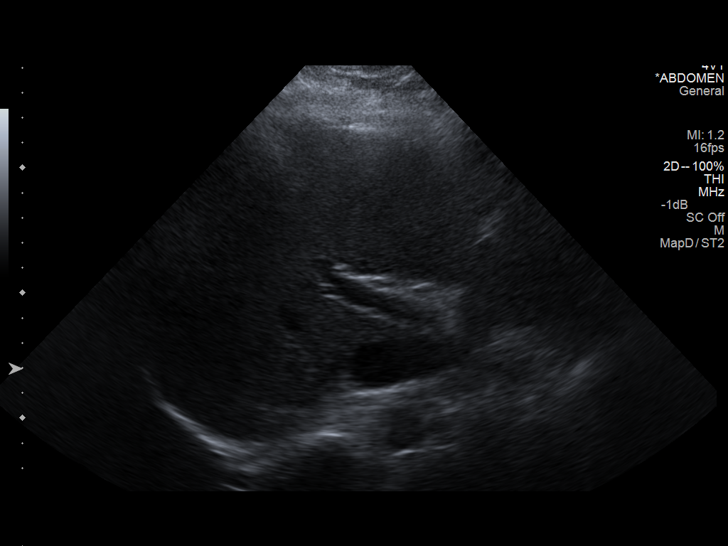
[im 33/79]
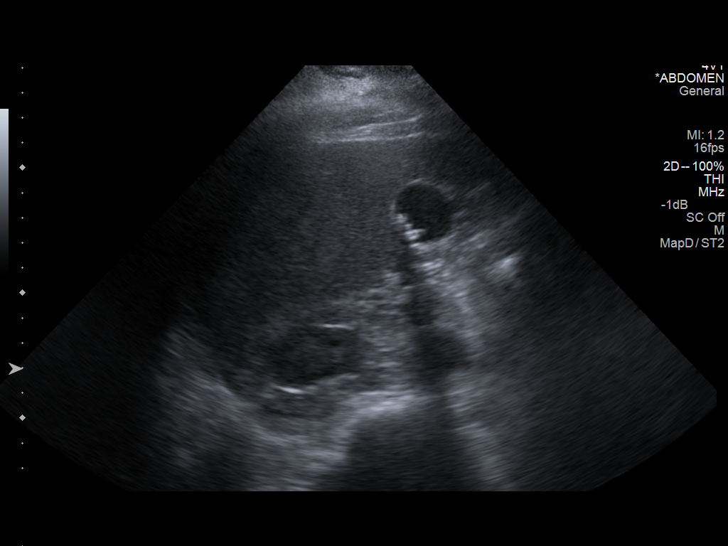
[im 40/79]
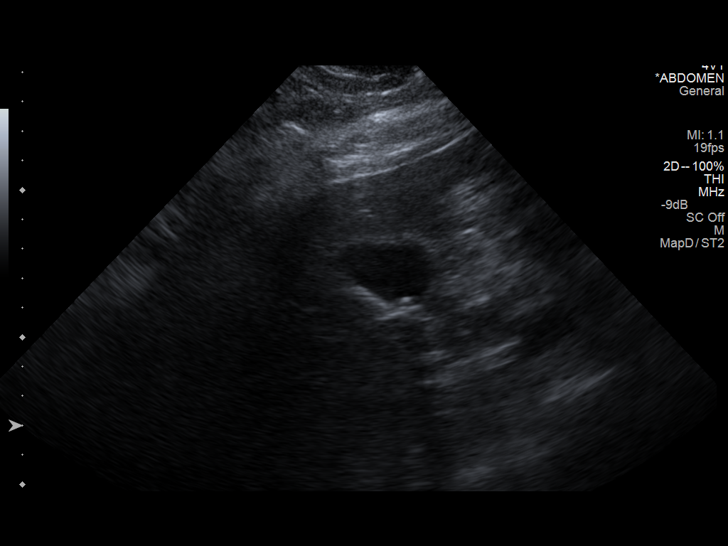
[im 46/79]
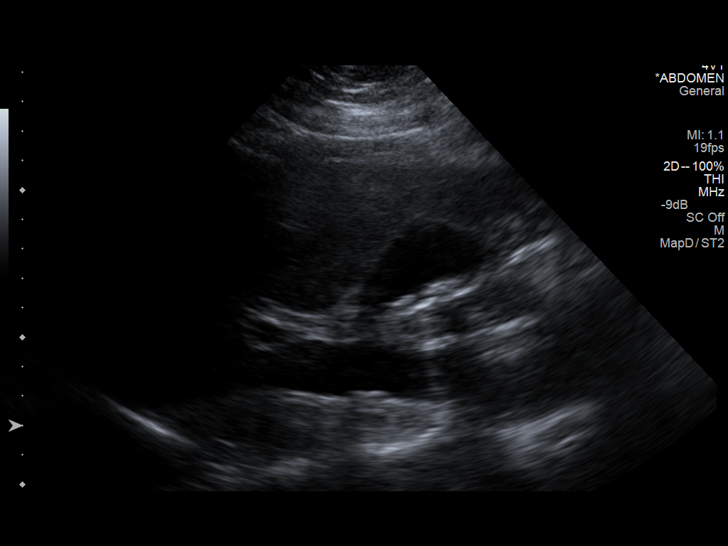
[im 53/79]
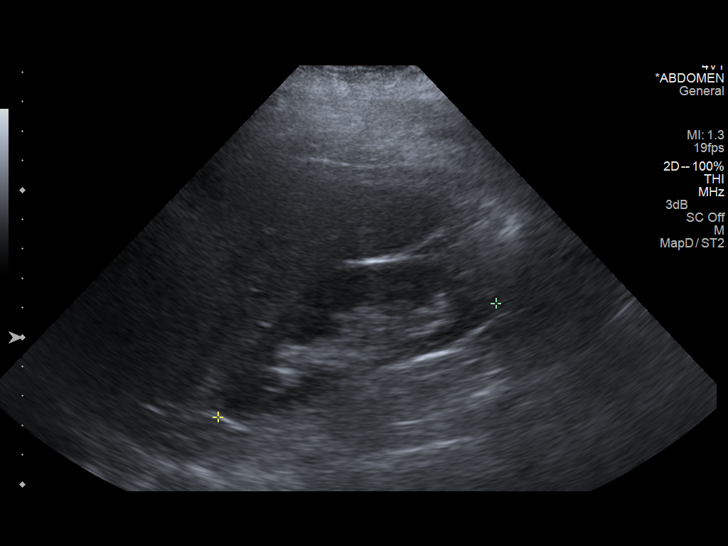
[im 59/79]
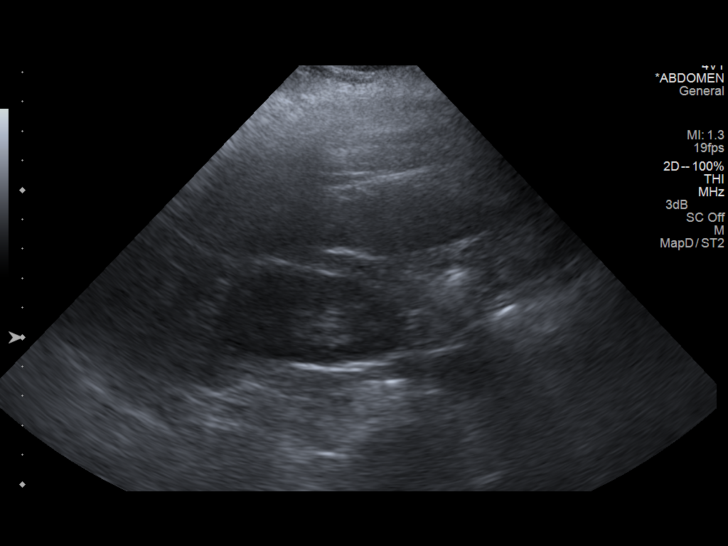
[im 66/79]
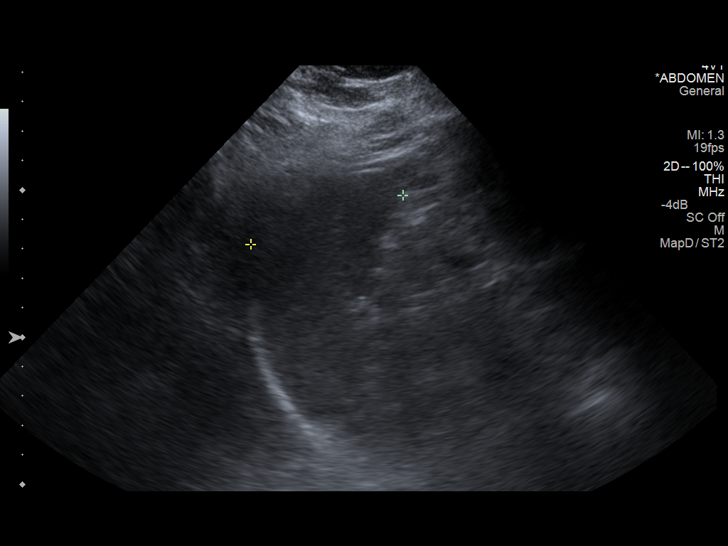
[im 72/79]
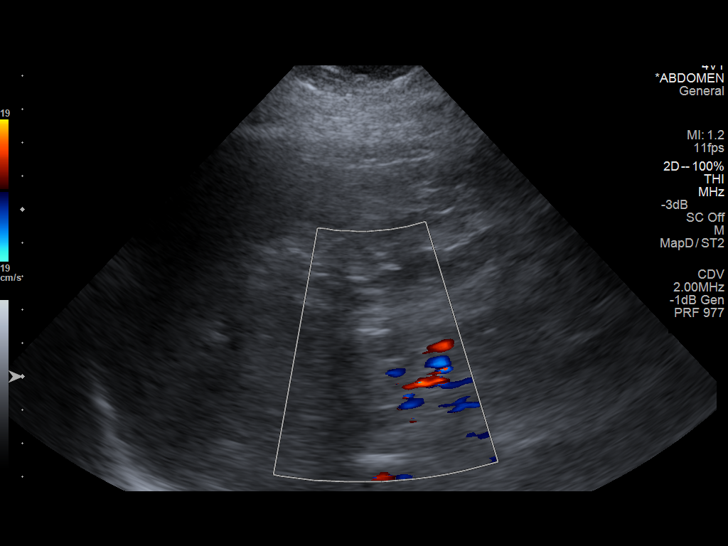
[im 79/79]
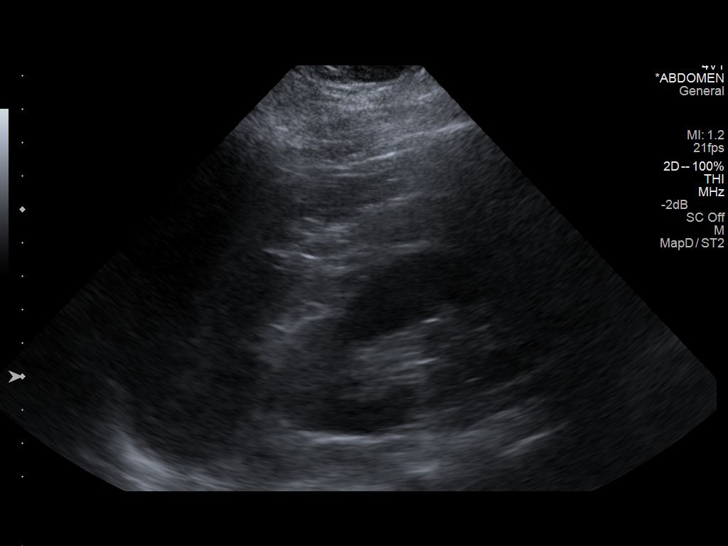

[13 of 25 positions shown; findings below may reference images not displayed]

FINDINGS: Gallbladder:

There are multiple punctate echogenic stones lying dependently
within otherwise normal-appearing gallbladder (representative images
42 and 43). No gallbladder wall thickening or pericholecystic fluid.
Negative sonographic Murphy sign.

Common bile duct:

Diameter: Normal in size measuring 5 mm in diameter

Liver:

There is diffuse increased echogenicity of the hepatic parenchyma.
No discrete hepatic lesions. No definite intrahepatic biliary ductal
dilatation. No ascites.

IVC:

No abnormality visualized.

Pancreas:

Limited visualization of the pancreatic head and neck is normal.
Visualization of the pancreatic body and tail is obscured by bowel
gas.

Spleen:

Normal in size measuring 5.4 cm in length

Right Kidney:

Normal cortical thickness, echogenicity and size, measuring 10.2 cm
in length. No focal renal lesions. No echogenic renal stones. No
urinary obstruction.

Left Kidney:

Normal cortical thickness, echogenicity and size, measuring 9.9 cm
in length. No focal renal lesions. No echogenic renal stones. No
urinary obstruction.

Abdominal aorta:

No aneurysm visualized.

Other findings:

None.
IMPRESSION: 1. Cholelithiasis without evidence of cholecystitis. Further
evaluation could be performed with HIDA scan (possibly with CCK
augmentation) as clinically indicated.
2. Hepatic steatosis.

## 2017-05-23 IMAGING — CR DG RIBS W/ CHEST 3+V*L*
3 series · 3 of 3 positions shown · non-contrast
Comparison: None.

CLINICAL DATA: Left-sided chest pain for several years, no known
injury, initial encounter

EXAM:
LEFT RIBS AND CHEST - 3+ VIEW

[PA]
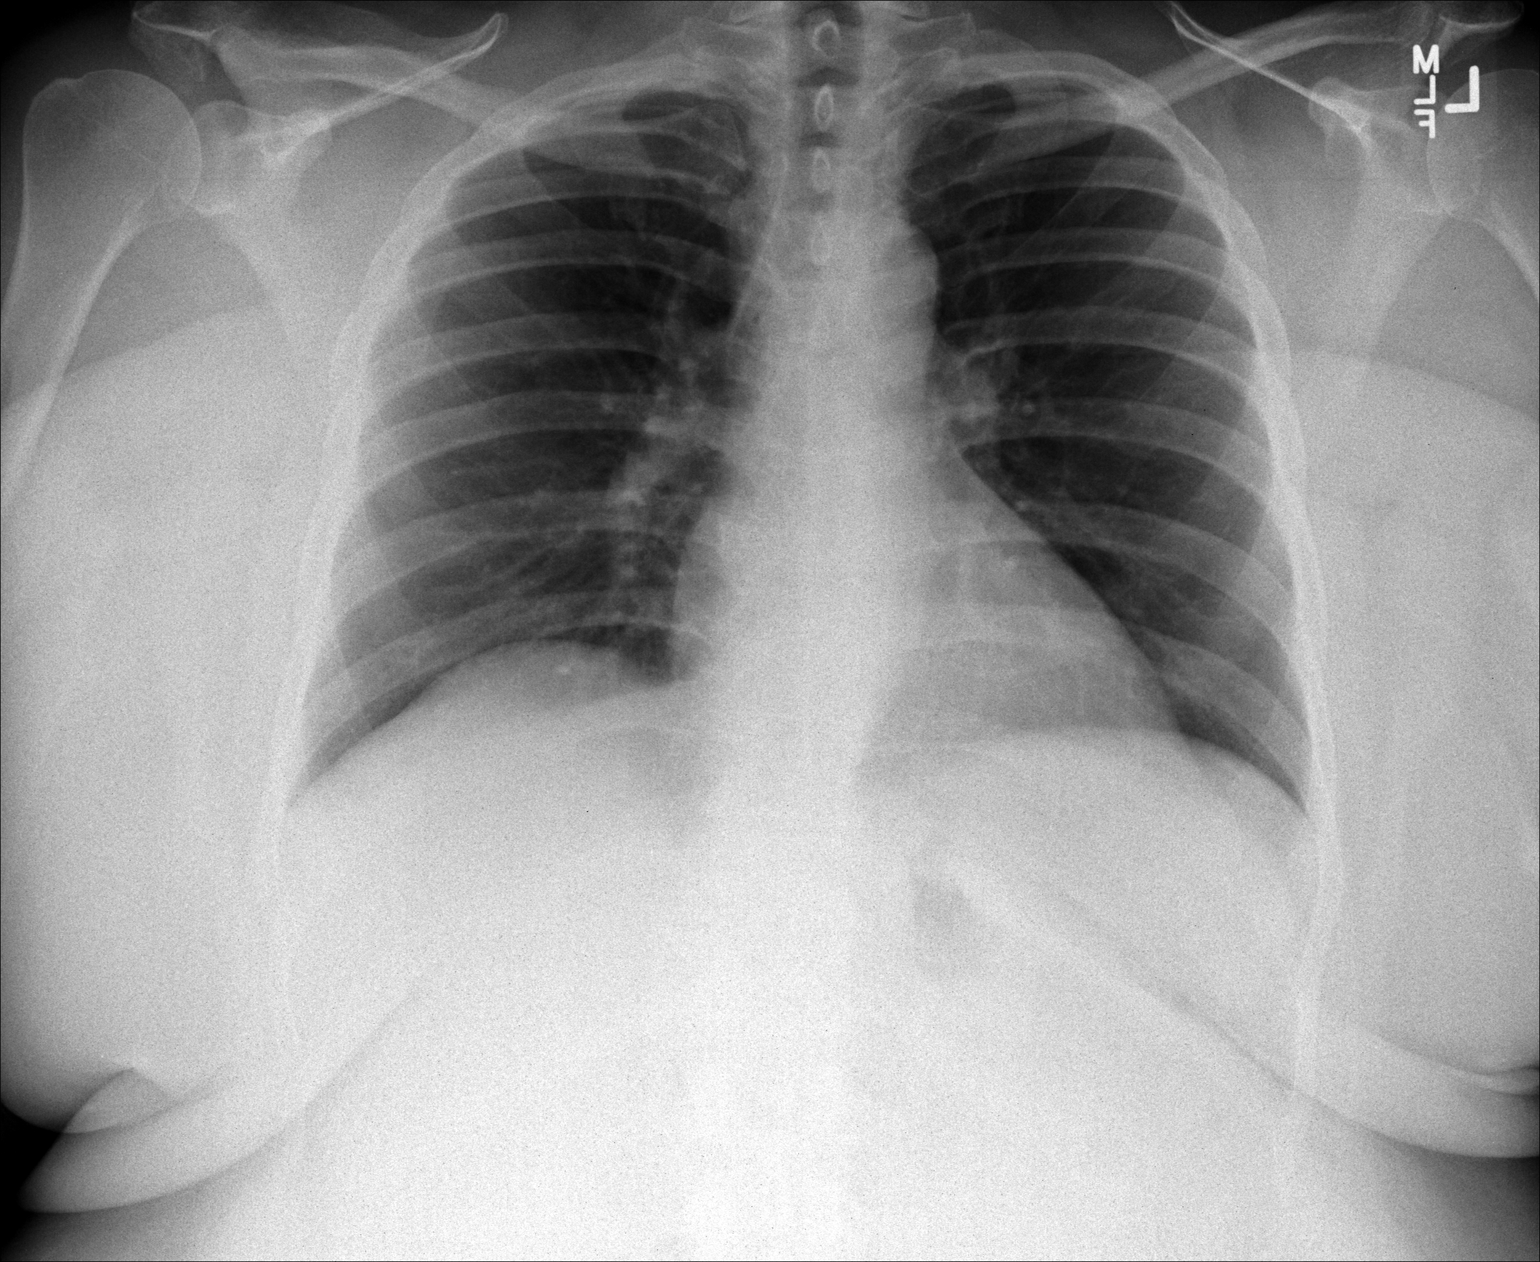

[oblique (1 of 2)]
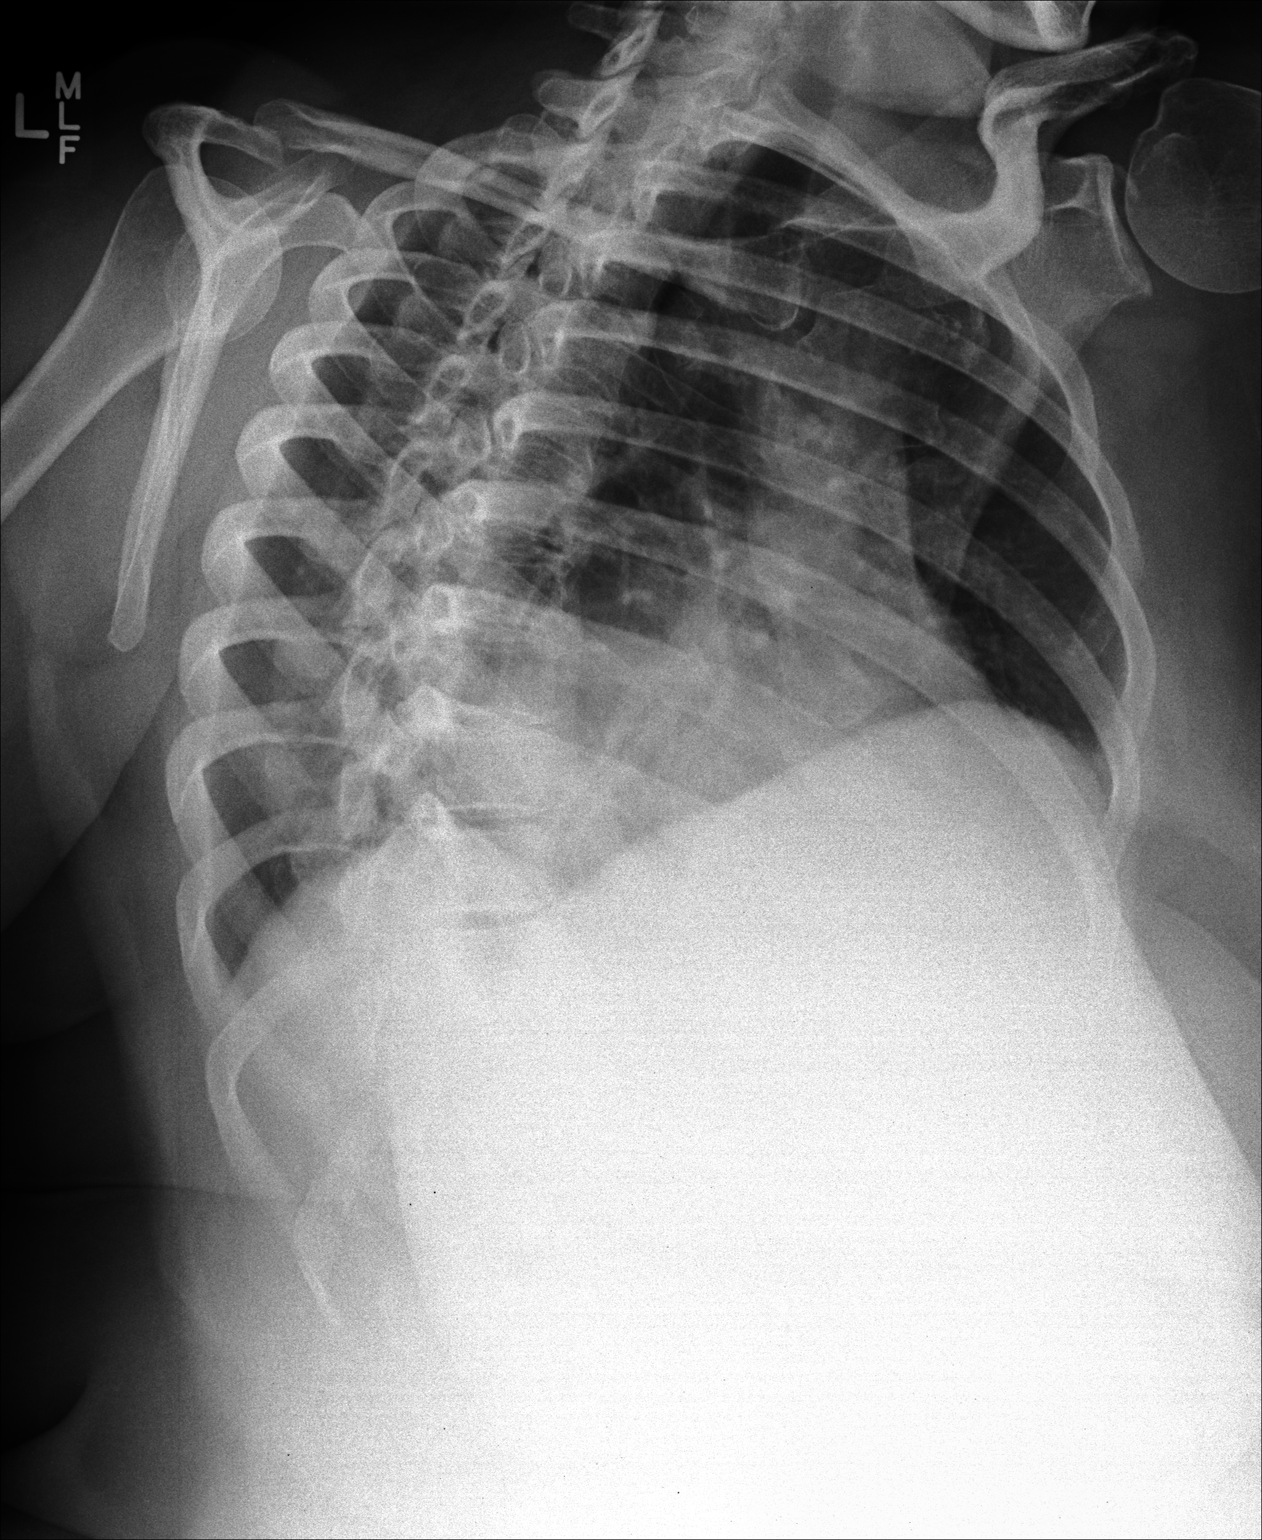

[oblique (2 of 2)]
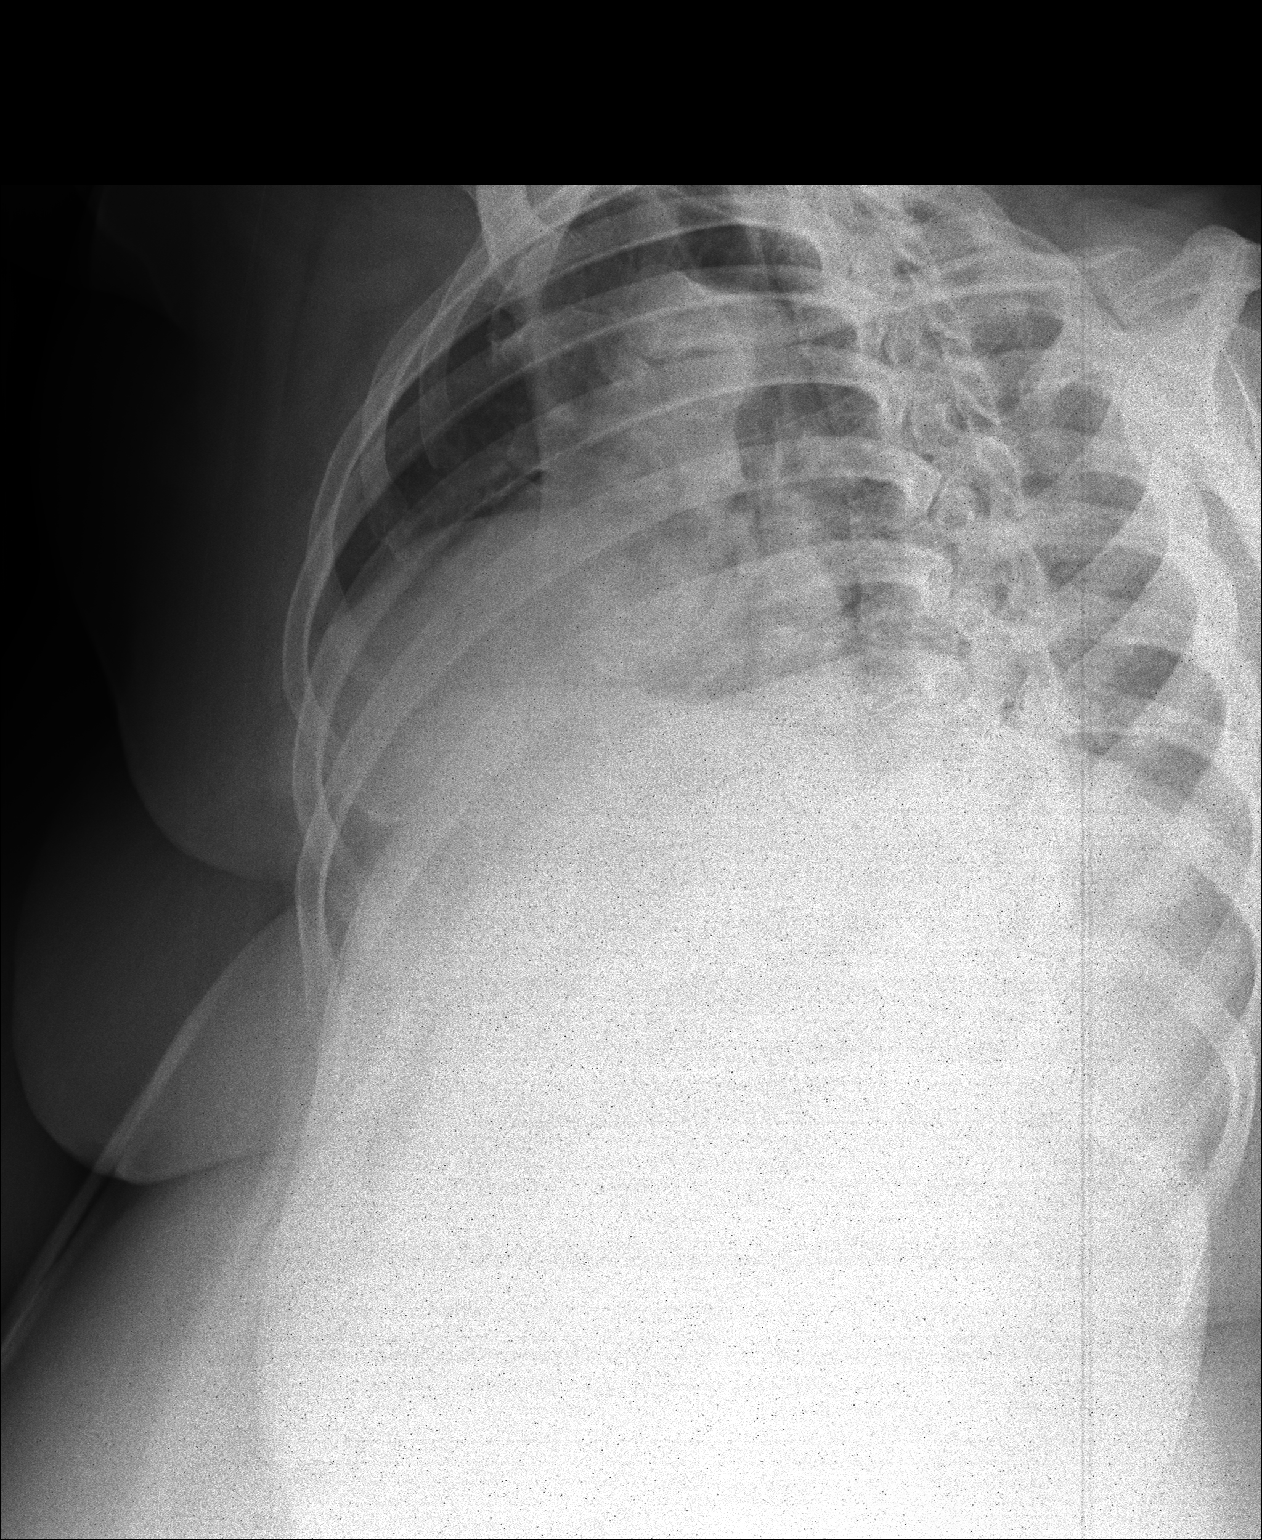

[3 of 3 positions shown; findings below may reference images not displayed]

FINDINGS: No fracture or other bone lesions are seen involving the ribs. There
is no evidence of pneumothorax or pleural effusion. Both lungs are
clear. Heart size and mediastinal contours are within normal limits.
IMPRESSION: No acute abnormality noted.

## 2018-05-14 ENCOUNTER — Emergency Department (HOSPITAL_COMMUNITY)
Admission: EM | Admit: 2018-05-14 | Discharge: 2018-05-14 | Disposition: A | Discharge status: Home / Self Care | Payer: 59 | Attending: Emergency Medicine | Admitting: Emergency Medicine

## 2018-05-14 ENCOUNTER — Encounter (HOSPITAL_COMMUNITY): Payer: Self-pay | Admitting: Emergency Medicine

## 2018-05-14 ENCOUNTER — Other Ambulatory Visit: Payer: Self-pay

## 2018-05-14 DIAGNOSIS — K219 Gastro-esophageal reflux disease without esophagitis: Secondary | ICD-10-CM | POA: Insufficient documentation

## 2018-05-14 DIAGNOSIS — R079 Chest pain, unspecified: Secondary | ICD-10-CM | POA: Insufficient documentation

## 2018-05-14 DIAGNOSIS — R198 Other specified symptoms and signs involving the digestive system and abdomen: Secondary | ICD-10-CM

## 2018-05-14 DIAGNOSIS — I10 Essential (primary) hypertension: Secondary | ICD-10-CM | POA: Diagnosis not present

## 2018-05-14 DIAGNOSIS — Z79899 Other long term (current) drug therapy: Secondary | ICD-10-CM | POA: Diagnosis not present

## 2018-05-14 DIAGNOSIS — R0789 Other chest pain: Secondary | ICD-10-CM

## 2018-05-14 LAB — BASIC METABOLIC PANEL
ANION GAP: 10 (ref 5–15)
BUN: 19 mg/dL (ref 8–23)
CO2: 24 mmol/L (ref 22–32)
Calcium: 9.4 mg/dL (ref 8.9–10.3)
Chloride: 105 mmol/L (ref 98–111)
Creatinine, Ser: 1.06 mg/dL — ABNORMAL HIGH (ref 0.44–1.00)
GFR calc Af Amer: >60 mL/min (ref >60)
GFR, EST NON AFRICAN AMERICAN: 56 mL/min — AB (ref >60)
Glucose, Bld: 91 mg/dL (ref 70–99)
Potassium: 3.9 mmol/L (ref 3.5–5.1)
Sodium: 139 mmol/L (ref 135–145)

## 2018-05-14 LAB — CBC
HCT: 40 % (ref 36.0–46.0)
Hemoglobin: 11.7 g/dL — ABNORMAL LOW (ref 12.0–15.0)
MCH: 24.7 pg — ABNORMAL LOW (ref 26.0–34.0)
MCHC: 29.3 g/dL — ABNORMAL LOW (ref 30.0–36.0)
MCV: 84.4 fL (ref 80.0–100.0)
Platelets: 345 10*3/uL (ref 150–400)
RBC: 4.74 MIL/uL (ref 3.87–5.11)
RDW: 15.4 % (ref 11.5–15.5)
WBC: 7.9 10*3/uL (ref 4.0–10.5)
nRBC: 0 % (ref 0.0–0.2)

## 2018-05-14 LAB — I-STAT TROPONIN, ED: TROPONIN I, POC: 0 ng/mL (ref 0.00–0.08)

## 2018-05-14 MED ORDER — FAMOTIDINE 20 MG PO TABS
20.0000 mg | ORAL_TABLET | Freq: Once | ORAL | Status: AC
  Administered 2018-05-14: 20 mg via ORAL
  Filled 2018-05-14: qty 1

## 2018-05-14 MED ORDER — ALUM & MAG HYDROXIDE-SIMETH 200-200-20 MG/5ML PO SUSP
30.0000 mL | Freq: Once | ORAL | Status: AC
  Administered 2018-05-14: 30 mL via ORAL
  Filled 2018-05-14: qty 30

## 2018-05-14 MED ORDER — LIDOCAINE VISCOUS HCL 2 % MT SOLN
15.0000 mL | Freq: Once | OROMUCOSAL | Status: AC
  Administered 2018-05-14: 15 mL via ORAL
  Filled 2018-05-14: qty 15

## 2018-05-14 MED ORDER — FAMOTIDINE IN NACL 20-0.9 MG/50ML-% IV SOLN: 20.0000 mg | Freq: Once | INTRAVENOUS | Status: DC

## 2018-05-14 MED ORDER — FAMOTIDINE 20 MG PO TABS: 20.0000 mg | ORAL_TABLET | Freq: Every day | ORAL | 0 refills | Status: DC

## 2018-05-14 MED ORDER — ALUM & MAG HYDROXIDE-SIMETH 400-400-40 MG/5ML PO SUSP: 5.0000 mL | Freq: Four times a day (QID) | ORAL | 0 refills | Status: DC | PRN

## 2018-05-14 NOTE — Discharge Instructions (Signed)
Continue taking home medications as prescribed. Take Pepcid daily to decrease symptoms. Use Maalox as needed for discomfort. Follow-up with a cardiologist for your stress test as scheduled. Follow-up with Dr. Collene Mares for further evaluation of your symptoms. Return to the emergency room for any new, worsening, concerning symptoms.

## 2018-05-14 NOTE — ED Triage Notes (Signed)
Patient c/o intermittent dull aching central chest pain with radiation to right shoulder and SOB x1 week. Seen at PCP yesterday with negative CXR.

## 2018-05-14 NOTE — ED Provider Notes (Signed)
[facility] DEPT Provider Note   CSN: [id number] Arrival date & time: 05/14/18  1145     History   Chief Complaint Chief Complaint  Patient presents with  . Chest Pain    HPI Jenna Ashley is a 62 y.o. female presenting for evaluation of chest pain, shortness of breath, nausea.  Patient states that the past week, she has been having intermittent episodes of shortness of breath.  She also reports intermittent episodes of central and right-sided chest pain.  She was evaluated by her PCP yesterday for the same, had reassuring x-ray and EKG.  Patient states symptoms are improved when she burps and takes Tums.  Symptoms are improved when she exercises.  She denies worsening with exertion.  Patient states she was treated for H. pylori, when she had similar symptoms.  Her symptoms have completely resolved until a week ago.  Additionally, patient states she has gallstones, but has not had a cholecystectomy.  She denies fevers, chills, cough, abdominal pain, vomiting, urinary symptoms, normal bowel movements.  Patient states she is taking Protonix as prescribed.  Associated history of hypertension, recently had medication changes.  Additional history obtained from chart review.  Patient saw her PCP yesterday.  Her BMP was rechecked due to recent worsening in creatinine, although improved during yesterday's lab.  Chest x-ray from yesterday reviewed.  Patient has cardiology referral from her PCP for stress test.  HPI  Past Medical History:  Diagnosis Date  . Allergy   . Arthritis   . Gall stones   . Gallstones   . Hypertension   . Ulcer     Patient Active Problem List   Diagnosis Date Noted  . Abdominal pain, left lower quadrant 07/28/2013  . Obesity, unspecified 03/24/2013    Past Surgical History:  Procedure Laterality Date  . TUBAL LIGATION  91 y ago     OB History   No obstetric history on file.      Home Medications    Prior to Admission  medications   Medication Sig Start Date End Date Taking? Authorizing Provider  alum & mag hydroxide-simeth (MAALOX MAX) 400-400-40 MG/5ML suspension Take 5 mLs by mouth every 6 (six) hours as needed for indigestion. 05/14/18   Estefana Taylor, PA-C  dextromethorphan-guaiFENesin (MUCINEX DM) 30-600 MG per 12 hr tablet Take 1 tablet by mouth every 12 (twelve) hours.    [provider]  famotidine (PEPCID) 20 MG tablet Take 1 tablet (20 mg total) by mouth daily. 05/14/18   Shalandra Leu, PA-C  ferrous sulfate 325 (65 FE) MG tablet Take once or twice a day as tolerated Patient not taking: Reported on 03/24/2015 12/06/14   Copland, Gay Filler, MD  Fluticasone-Salmeterol (ADVAIR) 100-50 MCG/DOSE AEPB Inhale 1 puff into the lungs every 12 (twelve) hours.    [provider]  gabapentin (NEURONTIN) 300 MG capsule Take 1 capsule (300 mg total) by mouth 2 (two) times daily. Take just 1 pill the first day of treatment 04/14/15   Copland, Gay Filler, MD  ibuprofen (ADVIL,MOTRIN) 200 MG tablet Take 400 mg by mouth every 6 (six) hours as needed for pain.    [provider]  triamterene-hydrochlorothiazide (MAXZIDE-25) 37.5-25 MG tablet Take 1 tablet by mouth daily. 03/24/15   Copland, Gay Filler, MD    Family History Family History  Problem Relation Age of Onset  . Heart disease Mother   . Hypertension Mother   . Stroke Mother   . Hypertension Father   .  Cancer Father   . Hypertension Sister   . Diabetes Brother   . Cancer Maternal Grandmother   . Mental illness Maternal Grandmother   . Stroke Maternal Grandmother   . Heart disease Maternal Grandfather   . Cancer Paternal Grandmother   . Cancer Paternal Grandfather   . Diabetes Brother     Social History Social History   Tobacco Use  . Smoking status: Never Smoker  Substance Use Topics  . Alcohol use: No  . Drug use: No     Allergies   Ivp dye [iodinated diagnostic agents]; Sulfa antibiotics; and  Sulfur   Review of Systems Review of Systems  Respiratory: Positive for shortness of breath (Intermittent).   Cardiovascular: Positive for chest pain (Intermittent, none currently).  Gastrointestinal: Positive for nausea.  All other systems reviewed and are negative.    Physical Exam Updated Vital Signs BP 130/69   Pulse 73   Temp 98.5 F (36.9 C)   Resp 18   SpO2 100%   Physical Exam Vitals signs and nursing note reviewed.  Constitutional:      General: She is not in acute distress.    Appearance: She is well-developed.     Comments: Obese female sitting comfortably in the bed in no acute distress.  HENT:     Head: Normocephalic and atraumatic.  Eyes:     Conjunctiva/sclera: Conjunctivae normal.     Pupils: Pupils are equal, round, and reactive to light.  Neck:     Musculoskeletal: Normal range of motion and neck supple.  Cardiovascular:     Rate and Rhythm: Normal rate and regular rhythm.     Pulses: Normal pulses.  Pulmonary:     Effort: Pulmonary effort is normal. No respiratory distress.     Breath sounds: Normal breath sounds. No wheezing.     Comments: Speaking in full sentences.  Clear lung sounds in all fields.  Tenderness to palpation of the right sided chest wall. Chest:     Chest wall: Tenderness present.  Abdominal:     General: There is no distension.     Palpations: Abdomen is soft. There is no mass.     Tenderness: There is no abdominal tenderness. There is no guarding or rebound.  Musculoskeletal: Normal range of motion.  Skin:    General: Skin is warm and dry.     Capillary Refill: Capillary refill takes less than 2 seconds.  Neurological:     Mental Status: She is alert and oriented to person, place, and time.      ED Treatments / Results  Labs (all labs ordered are listed, but only abnormal results are displayed) Labs Reviewed  BASIC METABOLIC PANEL - Abnormal; Notable for the following components:      Result Value   Creatinine, Ser  1.06 (*)    GFR calc non Af Amer 56 (*)    All other components within normal limits  CBC - Abnormal; Notable for the following components:   Hemoglobin 11.7 (*)    MCH 24.7 (*)    MCHC 29.3 (*)    All other components within normal limits  I-STAT TROPONIN, ED    EKG None  Radiology No results found.  Procedures Procedures (including critical care time)  Medications Ordered in ED Medications  alum & mag hydroxide-simeth (MAALOX/MYLANTA) 200-200-20 MG/5ML suspension 30 mL (30 mLs Oral Given 05/14/18 1354)    And  lidocaine (XYLOCAINE) 2 % viscous mouth solution 15 mL (15 mLs Oral Given 05/14/18 1354)  famotidine (PEPCID) tablet 20 mg (20 mg Oral Given 05/14/18 1353)     Initial Impression / Assessment and Plan / ED Course  I have reviewed the triage vital signs and the nursing notes.  Pertinent labs & imaging results that were available during my care of the patient were reviewed by me and considered in my medical decision making (see chart for details).     Pt presenting for evaluation of intermittent chest pain and shortness of breath with associated nausea.  Physical exam reassuring, she is afebrile and tachycardic.  Peers nontoxic.  Symptoms improved with Tums, belching, and exercise.  As such, lower suspicion for cardiac cause.  Additionally, pain is reproducible with palpation of right-sided chest wall.  However, considering obesity and recent worsening in creatinine, will obtain cardiac work-up and EKG.  Chest x-ray from yesterday reviewed, I do not believe repeat is necessary today. Will given tx for GI sxs and reassess.   Labs reassuring, troponin negative.  EKG without STEMI.  Hemoglobin stable.  SCr improved.  Symptoms have been going on for more than a day, do not believe delta troponin will be necessary.  Patient's blood pressure initially elevated, this resolved throughout her stay and with pain control.  As such, doubt hypertensive urgency/emergency. On  reassessment, patient reports symptoms are improved with Maalox/lidocaine and Pepcid.  Low suspicion for ACS, PE, myocarditis, endocarditis, or other acute/emergent cardiac or pulmonary cause.  Patient states that her stress test has been scheduled through her cardiologist.  She has seen Dr. Collene Mares with GI in the past for her gallstones.  Will have patient follow-up outpatient for cardiac stress test, and follow-up with Dr. Collene Mares regarding her symptoms.  At this time, patient appears safe for discharge.  Return precautions given.  Patient states she understands and agrees to plan.  Final Clinical Impressions(s) / ED Diagnoses   Final diagnoses:  Atypical chest pain  Symptoms of gastroesophageal reflux    ED Discharge Orders         Ordered    alum & mag hydroxide-simeth (MAALOX MAX) 417-408-14 MG/5ML suspension  Every 6 hours PRN     05/14/18 1424    famotidine (PEPCID) 20 MG tablet  Daily     05/14/18 1424           Franchot Heidelberg, PA-C 05/14/18 1454    Gareth Morgan, MD 05/14/18 1943

## 2018-05-14 NOTE — ED Notes (Signed)
Bed: WA18 Expected date:  Expected time:  Means of arrival:  Comments: Triage 1 

## 2019-07-26 ENCOUNTER — Ambulatory Visit: Payer: 59 | Attending: Internal Medicine

## 2019-07-26 DIAGNOSIS — Z23 Encounter for immunization: Secondary | ICD-10-CM

## 2019-07-26 NOTE — Progress Notes (Signed)
   Covid-19 Vaccination Clinic  Name:  Jenna Ashley    MRN: [id number] DOB: 04-Aug-1955  07/26/2019  Jenna Ashley was observed post Covid-19 immunization for 15 minutes without incident. She was provided with Vaccine Information Sheet and instruction to access the V-Safe system.   Jenna Ashley was instructed to call 911 with any severe reactions post vaccine: Marland Kitchen Difficulty breathing  . Swelling of face and throat  . A fast heartbeat  . A bad rash all over body  . Dizziness and weakness   Immunizations Administered    Name Date Dose VIS Date Route   Pfizer COVID-19 Vaccine 07/26/2019  2:11 PM 0.3 mL 04/25/2019 Intramuscular   Manufacturer: [place]   Lot: [id number]   [place]: [id number]

## 2019-08-19 ENCOUNTER — Ambulatory Visit: Payer: 59 | Attending: Internal Medicine

## 2019-08-19 DIAGNOSIS — Z23 Encounter for immunization: Secondary | ICD-10-CM

## 2019-08-19 NOTE — Progress Notes (Signed)
   Covid-19 Vaccination Clinic  Name:  Jenna Ashley    MRN: [id number] DOB: 07-20-1955  08/19/2019  Jenna Ashley was observed post Covid-19 immunization for 15 minutes without incident. She was provided with Vaccine Information Sheet and instruction to access the V-Safe system.   Jenna Ashley was instructed to call 911 with any severe reactions post vaccine: Marland Kitchen Difficulty breathing  . Swelling of face and throat  . A fast heartbeat  . A bad rash all over body  . Dizziness and weakness   Immunizations Administered    Name Date Dose VIS Date Route   Pfizer COVID-19 Vaccine 08/19/2019  2:07 PM 0.3 mL 04/25/2019 Intramuscular   Manufacturer: [place]   Lot: [id number]   [place]: [id number]

## 2020-03-29 DIAGNOSIS — K8010 Calculus of gallbladder with chronic cholecystitis without obstruction: Secondary | ICD-10-CM | POA: Diagnosis not present

## 2020-04-27 DIAGNOSIS — Z780 Asymptomatic menopausal state: Secondary | ICD-10-CM | POA: Diagnosis not present

## 2020-05-01 ENCOUNTER — Ambulatory Visit: Payer: 59 | Attending: Internal Medicine

## 2020-05-01 DIAGNOSIS — Z23 Encounter for immunization: Secondary | ICD-10-CM

## 2020-05-01 NOTE — Progress Notes (Signed)
2  Covid-19 Vaccination Clinic  Name:  WESLEE PRESTAGE    MRN: [id number] DOB: [birth year]  05/01/2020  Ms. Procell was observed post Covid-19 immunization for 15 minutes without incident. She was provided with Vaccine Information Sheet and instruction to access the V-Safe system.   Ms. Strong was instructed to call 911 with any severe reactions post vaccine: Marland Kitchen Difficulty breathing  . Swelling of face and throat  . A fast heartbeat  . A bad rash all over body  . Dizziness and weakness   Immunizations Administered    Name Date Dose VIS Date Route   Pfizer COVID-19 Vaccine 05/01/2020 11:33 AM 0.3 mL 03/03/2020 Intramuscular   Manufacturer: [place]   Lot: [id number]   [place]: [id number]

## 2020-05-26 DIAGNOSIS — M7661 Achilles tendinitis, right leg: Secondary | ICD-10-CM | POA: Diagnosis not present

## 2020-07-01 DIAGNOSIS — J4530 Mild persistent asthma, uncomplicated: Secondary | ICD-10-CM | POA: Diagnosis not present

## 2020-07-01 DIAGNOSIS — Z6841 Body Mass Index (BMI) 40.0 and over, adult: Secondary | ICD-10-CM | POA: Diagnosis not present

## 2020-07-07 DIAGNOSIS — M7661 Achilles tendinitis, right leg: Secondary | ICD-10-CM | POA: Diagnosis not present

## 2020-11-25 DIAGNOSIS — I10 Essential (primary) hypertension: Secondary | ICD-10-CM | POA: Diagnosis not present

## 2020-11-25 DIAGNOSIS — E7800 Pure hypercholesterolemia, unspecified: Secondary | ICD-10-CM | POA: Diagnosis not present

## 2020-11-25 DIAGNOSIS — J0140 Acute pansinusitis, unspecified: Secondary | ICD-10-CM | POA: Diagnosis not present

## 2021-02-17 DIAGNOSIS — Z6841 Body Mass Index (BMI) 40.0 and over, adult: Secondary | ICD-10-CM | POA: Diagnosis not present

## 2021-02-17 DIAGNOSIS — N1831 Chronic kidney disease, stage 3a: Secondary | ICD-10-CM | POA: Diagnosis not present

## 2021-02-17 DIAGNOSIS — N183 Chronic kidney disease, stage 3 unspecified: Secondary | ICD-10-CM | POA: Diagnosis not present

## 2021-02-17 DIAGNOSIS — I129 Hypertensive chronic kidney disease with stage 1 through stage 4 chronic kidney disease, or unspecified chronic kidney disease: Secondary | ICD-10-CM | POA: Diagnosis not present

## 2021-02-22 DIAGNOSIS — N1831 Chronic kidney disease, stage 3a: Secondary | ICD-10-CM | POA: Diagnosis not present

## 2021-02-22 DIAGNOSIS — N281 Cyst of kidney, acquired: Secondary | ICD-10-CM | POA: Diagnosis not present

## 2021-02-22 DIAGNOSIS — N189 Chronic kidney disease, unspecified: Secondary | ICD-10-CM | POA: Diagnosis not present

## 2021-03-23 DIAGNOSIS — E7800 Pure hypercholesterolemia, unspecified: Secondary | ICD-10-CM | POA: Diagnosis not present

## 2021-04-21 DIAGNOSIS — U071 COVID-19: Secondary | ICD-10-CM | POA: Diagnosis not present

## 2021-04-21 DIAGNOSIS — R051 Acute cough: Secondary | ICD-10-CM | POA: Diagnosis not present

## 2021-12-20 ENCOUNTER — Emergency Department (HOSPITAL_COMMUNITY): Payer: PPO

## 2021-12-20 ENCOUNTER — Encounter (HOSPITAL_COMMUNITY): Payer: Self-pay | Admitting: Emergency Medicine

## 2021-12-20 ENCOUNTER — Emergency Department (HOSPITAL_COMMUNITY)
Admission: EM | Admit: 2021-12-20 | Discharge: 2021-12-20 | Disposition: A | Discharge status: Home / Self Care | Payer: PPO | Attending: Emergency Medicine | Admitting: Emergency Medicine

## 2021-12-20 ENCOUNTER — Other Ambulatory Visit: Payer: Self-pay

## 2021-12-20 DIAGNOSIS — R109 Unspecified abdominal pain: Secondary | ICD-10-CM

## 2021-12-20 DIAGNOSIS — M549 Dorsalgia, unspecified: Secondary | ICD-10-CM | POA: Diagnosis not present

## 2021-12-20 DIAGNOSIS — Z79899 Other long term (current) drug therapy: Secondary | ICD-10-CM | POA: Diagnosis not present

## 2021-12-20 DIAGNOSIS — I129 Hypertensive chronic kidney disease with stage 1 through stage 4 chronic kidney disease, or unspecified chronic kidney disease: Secondary | ICD-10-CM | POA: Insufficient documentation

## 2021-12-20 DIAGNOSIS — R630 Anorexia: Secondary | ICD-10-CM | POA: Diagnosis not present

## 2021-12-20 DIAGNOSIS — R1032 Left lower quadrant pain: Secondary | ICD-10-CM | POA: Diagnosis present

## 2021-12-20 DIAGNOSIS — R110 Nausea: Secondary | ICD-10-CM | POA: Diagnosis not present

## 2021-12-20 DIAGNOSIS — N1831 Chronic kidney disease, stage 3a: Secondary | ICD-10-CM | POA: Diagnosis not present

## 2021-12-20 LAB — BASIC METABOLIC PANEL
Anion gap: 9 (ref 5–15)
BUN: 18 mg/dL (ref 8–23)
CO2: 25 mmol/L (ref 22–32)
Calcium: 9.3 mg/dL (ref 8.9–10.3)
Chloride: 104 mmol/L (ref 98–111)
Creatinine, Ser: 1.25 mg/dL — ABNORMAL HIGH (ref 0.44–1.00)
GFR, Estimated: 48 mL/min — ABNORMAL LOW (ref >60)
Glucose, Bld: 90 mg/dL (ref 70–99)
Potassium: 3.3 mmol/L — ABNORMAL LOW (ref 3.5–5.1)
Sodium: 138 mmol/L (ref 135–145)

## 2021-12-20 LAB — URINALYSIS, ROUTINE W REFLEX MICROSCOPIC
Bilirubin Urine: NEGATIVE
Glucose, UA: NEGATIVE mg/dL
Hgb urine dipstick: NEGATIVE
Ketones, ur: NEGATIVE mg/dL
Leukocytes,Ua: NEGATIVE
Nitrite: NEGATIVE
Protein, ur: NEGATIVE mg/dL
Specific Gravity, Urine: 1.009 (ref 1.005–1.030)
pH: 6 (ref 5.0–8.0)

## 2021-12-20 LAB — CBC
HCT: 37.3 % (ref 36.0–46.0)
Hemoglobin: 11.8 g/dL — ABNORMAL LOW (ref 12.0–15.0)
MCH: 26.1 pg (ref 26.0–34.0)
MCHC: 31.6 g/dL (ref 30.0–36.0)
MCV: 82.5 fL (ref 80.0–100.0)
Platelets: 276 10*3/uL (ref 150–400)
RBC: 4.52 MIL/uL (ref 3.87–5.11)
RDW: 14.9 % (ref 11.5–15.5)
WBC: 7 10*3/uL (ref 4.0–10.5)
nRBC: 0 % (ref 0.0–0.2)

## 2021-12-20 LAB — LIPASE, BLOOD: Lipase: 30 U/L (ref 11–51)

## 2021-12-20 LAB — TROPONIN I (HIGH SENSITIVITY)
Troponin I (High Sensitivity): 5 ng/L (ref <18)
Troponin I (High Sensitivity): 4 ng/L (ref <18)

## 2021-12-20 LAB — HEPATIC FUNCTION PANEL
ALT: 15 U/L (ref 0–44)
AST: 24 U/L (ref 15–41)
Albumin: 3.9 g/dL (ref 3.5–5.0)
Alkaline Phosphatase: 73 U/L (ref 38–126)
Bilirubin, Direct: <0.1 mg/dL (ref 0.0–0.2)
Total Bilirubin: 0.8 mg/dL (ref 0.3–1.2)
Total Protein: 7.6 g/dL (ref 6.5–8.1)

## 2021-12-20 MED ORDER — METHOCARBAMOL 500 MG PO TABS: 500.0000 mg | ORAL_TABLET | Freq: Three times a day (TID) | ORAL | 0 refills | Status: DC | PRN

## 2021-12-20 MED ORDER — POTASSIUM CHLORIDE CRYS ER 20 MEQ PO TBCR: 60.0000 meq | EXTENDED_RELEASE_TABLET | Freq: Once | ORAL | 0 refills | Status: DC

## 2021-12-20 NOTE — ED Notes (Signed)
Patient transported to CT 

## 2021-12-20 NOTE — ED Provider Notes (Signed)
[facility] EMERGENCY DEPARTMENT Provider Note   CSN: [id number] Arrival date & time: 12/20/21  1102     History  Chief Complaint  Patient presents with   Back Pain    Jenna Ashley is a 66 y.o. female with PMHx of HTN and CKD IIIa presenting to the ED with complaint of back pain and LLQ pain. She states she has back pain that has been intermittent in nature for a few years and was previously given muscle relaxer as needed. She states this pain started 2 weeks ago and was located on middle and left side of the back. It has progressed to LLQ after 1 week and now includes nausea and decreased appetite.  She went to see her primary care doctor 1 week ago and was given antibiotics as her UA was positive for nitrites and RBCs.  Her last day of antibiotics will be tomorrow.  She states she has not felt any symptomatic improvement with the antibiotics. She is denying any fevers or chills.    Home Medications Prior to Admission medications   Medication Sig Start Date End Date Taking? Authorizing Provider  albuterol (VENTOLIN HFA) 108 (90 Base) MCG/ACT inhaler Inhale 2 puffs into the lungs every 6 (six) hours as needed for wheezing or shortness of breath. 10/17/21  Yes [provider]  alum & mag hydroxide-simeth (MAALOX MAX) 400-400-40 MG/5ML suspension Take 5 mLs by mouth every 6 (six) hours as needed for indigestion. 05/14/18  Yes Caccavale, Sophia, PA-C  ascorbic acid ([employer] VITAMIN C) 500 MG tablet Take 500 mg by mouth daily.   Yes [provider]  cephALEXin (KEFLEX) 500 MG capsule Take 500 mg by mouth 4 (four) times daily. 12/14/21  Yes [provider]  cholecalciferol (VITAMIN D3) 25 MCG (1000 UNIT) tablet Take 1,000 Units by mouth daily.   Yes [provider]  famotidine (PEPCID) 20 MG tablet Take 1 tablet (20 mg total) by mouth daily. Patient taking differently: Take 20 mg by mouth daily as needed for heartburn or indigestion. 05/14/18  Yes  Caccavale, Sophia, PA-C  Fluticasone-Salmeterol (ADVAIR) 100-50 MCG/DOSE AEPB Inhale 1 puff into the lungs every 12 (twelve) hours.   Yes [provider]  hydrOXYzine (ATARAX) 25 MG tablet Take 25 mg by mouth every 6 (six) hours as needed for itching. 10/11/21  Yes [provider]  naproxen sodium (ALEVE) 220 MG tablet Take 220 mg by mouth daily as needed (pain).   Yes [provider]  Omega-3 Fatty Acids (FISH OIL) 1000 MG CAPS Take 1 capsule by mouth daily.   Yes [provider]  triamterene-hydrochlorothiazide (MAXZIDE) 75-50 MG tablet Take 1 tablet by mouth daily. 11/11/21  Yes [provider]  ferrous sulfate 325 (65 FE) MG tablet Take once or twice a day as tolerated Patient not taking: Reported on 03/24/2015 12/06/14   Copland, Gay Filler, MD  gabapentin (NEURONTIN) 300 MG capsule Take 1 capsule (300 mg total) by mouth 2 (two) times daily. Take just 1 pill the first day of treatment Patient not taking: Reported on 12/20/2021 04/14/15   Copland, Gay Filler, MD  triamterene-hydrochlorothiazide (MAXZIDE-25) 37.5-25 MG tablet Take 1 tablet by mouth daily. Patient not taking: Reported on 12/20/2021 03/24/15   Copland, Gay Filler, MD      Allergies    Ivp dye [iodinated contrast media], Elemental sulfur, and Sulfa antibiotics    Review of Systems   Review of Systems  Constitutional:  Positive for appetite change and chills.  Negative for diaphoresis, fatigue and fever.  HENT:  Negative for ear pain, facial swelling, mouth sores, sinus pressure and sinus pain.   Eyes:  Negative for discharge and itching.  Respiratory:  Negative for apnea, cough, choking, chest tightness and shortness of breath.   Cardiovascular:  Positive for chest pain (intermitent, gassy feeling). Negative for leg swelling.  Gastrointestinal:  Positive for abdominal pain (LLQ). Negative for abdominal distention.  Endocrine: Negative for cold intolerance and heat intolerance.  Genitourinary:   Positive for flank pain. Negative for difficulty urinating, dyspareunia and dysuria.  Musculoskeletal:  Positive for back pain. Negative for gait problem and joint swelling.  Skin:  Negative for pallor and rash.  Neurological:  Negative for dizziness, facial asymmetry, light-headedness and headaches.  Hematological:  Negative for adenopathy. Does not bruise/bleed easily.  Psychiatric/Behavioral:  Negative for agitation and behavioral problems.     Physical Exam Updated Vital Signs BP 108/66   Pulse (!) 59   Temp 98 F (36.7 C) (Oral)   Resp 18   SpO2 96%  Physical Exam: VEH:[id number] obese female in NAD HENT:NCAT CV: RRR, 2+ pulses Resp: CTAB Abd: No TTP, soft abdomen, bowel sounds present MSK: No asymmetry Skin: No lesions on exposed skin Neuro: alert and oriented x4 Psych: normal mood and normal affect  ED Results / Procedures / Treatments   Labs (all labs ordered are listed, but only abnormal results are displayed) Labs Reviewed  BASIC METABOLIC PANEL - Abnormal; Notable for the following components:      Result Value   Potassium 3.3 (*)    Creatinine, Ser 1.25 (*)    GFR, Estimated 48 (*)    All other components within normal limits  CBC - Abnormal; Notable for the following components:   Hemoglobin 11.8 (*)    All other components within normal limits  URINALYSIS, ROUTINE W REFLEX MICROSCOPIC - Abnormal; Notable for the following components:   Color, Urine STRAW (*)    All other components within normal limits  LIPASE, BLOOD  HEPATIC FUNCTION PANEL  TROPONIN I (HIGH SENSITIVITY)  TROPONIN I (HIGH SENSITIVITY)    EKG None  Radiology DG Chest 2 View  Result Date: 12/20/2021 CLINICAL DATA:  Chest pain. EXAM: CHEST - 2 VIEW COMPARISON:  Chest x-ray 06/09/2021. FINDINGS: The heart size and mediastinal contours are within normal limits. Both lungs are clear. No visible pleural effusions or pneumothorax. No acute osseous abnormality. IMPRESSION: No active  cardiopulmonary disease. Electronically Signed   By: Margaretha Sheffield M.D.   On: 12/20/2021 11:58    Procedures NA  Medications Ordered in ED Medications - No data to display  ED Course/ Medical Decision Making/ A&P                           Medical Decision Making Patient presents with back pain and left lower quadrant pain with nausea and decreased appetite.  Differential diagnoses include nephrolithiasis versus diverticulitis versus pancreatitis.  Pyelonephritis was concerning but less likely given normal UA although patient is getting antibiotics.  Parotitis less likely given normal lipase.  Plan is to get CT of the abdomen with contrast to better characterize her etiology of pain.  Patient had intermittent chest pains that resolved with eructation.  ACS was most concerning but ruled out with normal EKG and negative troponin x2.  CBC does not show any signs of acute infections or acute blood loss. -Follow-up CT results -Pain control as needed: Patient currently denying  any need for pain states her pain is 7/10 but manageable.  1630: Patient signed out to evening provider for continued care. CTAP pending at this time. Pt comfortable at this time as well.   Amount and/or Complexity of Data Reviewed External Data Reviewed: labs, radiology, ECG and notes.    Details: Data reviewed prior to patient discussion with patient. Labs: ordered. Decision-making details documented in ED Course.    Details: CBC, BMP, Lipase, Troponin x2, UA ordered. Radiology: ordered and independent interpretation performed. Decision-making details documented in ED Course.    Details: CT abdomen and pelvis with contrast, CXR ECG/medicine tests: ordered and independent interpretation performed. Decision-making details documented in ED Course.  Risk OTC drugs. Prescription drug management.           Final Clinical Impression(s) / ED Diagnoses Final diagnoses:  None    Rx / DC Orders ED Discharge  Orders     None       Idamae Schuller, MD [facility] Internal Medicine Residency, PGY-2    Idamae Schuller, MD 00/51/10 2111    Campbell Stall P, DO 73/56/70 1013

## 2021-12-20 NOTE — ED Triage Notes (Signed)
Patient complains of left sided back pain that radiates from the lower to upper back. Patient also complains of nausea and chest pain. Patient is alert, oriented, ambulatory, and in no apparent distress at this time.

## 2021-12-20 NOTE — ED Provider Triage Note (Signed)
Emergency Medicine Provider Triage Evaluation Note  TERIAH MUELA , a 66 y.o. female  was evaluated in triage.  Pt complains of pain, chest pain and nausea.  Patient reports issues with left back pain, current episode has been going on for about a week, pain starts in the left low back and radiates up around into the left chest and left side of the abdomen.  Associated with some nausea.  Chest pain started hurting more the past few days.  Review of Systems  Positive: Chest pain, back pain, abdominal pain, nausea Negative: Fevers, vomiting, diarrhea, const patient  Physical Exam  BP 125/84 (BP Location: Right Arm)   Pulse 75   Temp 98.2 F (36.8 C) (Oral)   Resp 16   SpO2 98%  Gen:   Awake, no distress   Resp:  Normal effort, left-sided chest wall tenderness MSK:   Moves extremities without difficulty left low back tenderness Other:  Some mild tenderness in the left upper quadrant  Medical Decision Making  Medically screening exam initiated at 11:42 AM.  Appropriate orders placed.  ZIPPORAH FINAMORE was informed that the remainder of the evaluation will be completed by another provider, this initial triage assessment does not replace that evaluation, and the importance of remaining in the ED until their evaluation is complete.     Jacqlyn Larsen, [place] 12/20/21 1144

## 2023-01-03 ENCOUNTER — Other Ambulatory Visit: Payer: Self-pay | Admitting: Orthopedic Surgery

## 2023-01-03 DIAGNOSIS — M542 Cervicalgia: Secondary | ICD-10-CM

## 2023-01-03 DIAGNOSIS — M545 Low back pain, unspecified: Secondary | ICD-10-CM

## 2023-02-02 ENCOUNTER — Ambulatory Visit
Admission: RE | Admit: 2023-02-02 | Discharge: 2023-02-02 | Disposition: A | Discharge status: Home / Self Care | Payer: PPO | Source: Ambulatory Visit | Attending: Orthopedic Surgery | Admitting: Orthopedic Surgery

## 2023-02-02 DIAGNOSIS — M542 Cervicalgia: Secondary | ICD-10-CM

## 2023-02-02 DIAGNOSIS — M545 Low back pain, unspecified: Secondary | ICD-10-CM

## 2023-06-03 NOTE — Progress Notes (Unsigned)
[facility]  PATIENT: Jenna Ashley DOB: Sep 29, 1955  REFERRING DOCTOR OR PCP:  Estill Bamberg, MD; Truett Mainland, MD SOURCE: Patient, notes from orthopedics, imaging and lab reports, MRI images personally reviewed.  _________________________________   HISTORICAL  CHIEF COMPLAINT:  Chief Complaint  Patient presents with   Room 10    Pt is here with her Husband. Pt states that her numbness travels from her neck on her left side down to her left hand. Pt states that she has numbness from her knees stopping before it gets to her ankles. Pt states that she may get 4-5 hours of sleep per night, more like cat naps. Pt states that her left  arm pain gets so bad that it is hard for her to put her pants on or her bra due to the pain. Pt states that she has a burning and tingling sensation on her left side.       HISTORY OF PRESENT ILLNESS:  I had the pleasure of seeing your patient, Jenna Ashley, at [facility] for neurologic consultation regarding her dysesthesias and abnormal cervical spine MRI.  She is a 68 year old woman who ahd the onset of numbness starting in the left lower back/leg and then rising to the neck, only on left.    Due to symptoms, she had an MRI of the cervical spine showing a focus at C1C2.    Compared to the first week of symptoms, she has more pain in the left arm but continued numbness on the left from shoulder to leg.  Pain is sharp.     She reports some other times in the past when she noted left sided numbness and pain but never as bad as it has been since November.  She was placed on gabapentin 300 mg po bid but did not note a benefit so stopped.   Methocarbamol had not helped.   She denies visual changes and her optic nerves were reportedly fine (Dr. Shea Evans).   She is correctable to excellent vision in both eyes  She also reports bilateral knee pain that comes and goes.     She has poor sleep and gets 4-5 hours most nights -  more trouble with maintenance than onset.    Of note, she thinks she had Covid a few weeks before eher symptoms (not in Epic)  Imaging: MRI of the cervical spine 02/02/2023 shows a T2/STIR hyperintense focus within the spinal cord, possibly within the posterior columns adjacent to C1-C2.  Unfortunately, axial images did not cover this region.  The study was done without contrast.  Elsewhere in the spine, there are degenerative changes at C4-C5 causing moderate right greater than left foraminal narrowing and moderate degenerative changes at C5-C6 causing mild foraminal narrowing.  No definite nerve root compression.  MRI of the lumbar spine 02/02/2023 showed reduced disc height and endplate degenerative changes at T11-T12 and T12-L1.  No spinal stenosis or nerve root compression at these levels.  Stenosis at due to disc bulging, facet hypertrophy and increased epidural fat.  There is mild lateral recess stenosis but no nerve root compression.,  Facet hypertrophy and increased epidural fat causing mild foraminal narrowing and minimal lateral recess stenosis but no nerve root compression.  REVIEW OF SYSTEMS: Constitutional: No fevers, chills, sweats, or change in appetite Eyes: No visual changes, double vision, eye pain Ear, nose and throat: No hearing loss, ear pain, nasal congestion, sore throat Cardiovascular: No chest pain, palpitations Respiratory:  No shortness  of breath at rest or with exertion.   No wheezes GastrointestinaI: No nausea, vomiting, diarrhea, abdominal pain, fecal incontinence Genitourinary:  No dysuria, urinary retention or frequency.  No nocturia. Musculoskeletal:  No neck pain, back pain Integumentary: No rash, pruritus, skin lesions Neurological: as above Psychiatric: No depression at this time.  No anxiety Endocrine: No palpitations, diaphoresis, change in appetite, change in weigh or increased thirst Hematologic/Lymphatic:  No anemia, purpura,  petechiae. Allergic/Immunologic: No itchy/runny eyes, nasal congestion, recent allergic reactions, rashes  ALLERGIES: Allergies  Allergen Reactions   Ivp Dye [Iodinated Contrast Media] Anaphylaxis   Elemental Sulfur Itching   Sulfa Antibiotics Itching    HOME MEDICATIONS:  Current Outpatient Medications:    albuterol (VENTOLIN HFA) 108 (90 Base) MCG/ACT inhaler, Inhale 2 puffs into the lungs every 6 (six) hours as needed for wheezing or shortness of breath., Disp: , Rfl:    ascorbic acid ([employer] VITAMIN C) 500 MG tablet, Take 500 mg by mouth daily., Disp: , Rfl:    cholecalciferol (VITAMIN D3) 25 MCG (1000 UNIT) tablet, Take 1,000 Units by mouth daily., Disp: , Rfl:    famotidine (PEPCID) 20 MG tablet, Take 1 tablet (20 mg total) by mouth daily. (Patient taking differently: Take 20 mg by mouth daily as needed for heartburn or indigestion.), Disp: 30 tablet, Rfl: 0   Fluticasone-Salmeterol (ADVAIR) 100-50 MCG/DOSE AEPB, Inhale 1 puff into the lungs every 12 (twelve) hours., Disp: , Rfl:    hydrOXYzine (ATARAX) 25 MG tablet, Take 25 mg by mouth every 6 (six) hours as needed for itching., Disp: , Rfl:    lamoTRIgine (LAMICTAL) 25 MG tablet, One po every day x 5 days, then one po bid x 5 d, then one po qAM and two po qPM x 5 d, then 2 po bid, Disp: 120 tablet, Rfl: 5   naproxen sodium (ALEVE) 220 MG tablet, Take 220 mg by mouth daily as needed (pain)., Disp: , Rfl:    Omega-3 Fatty Acids (FISH OIL) 1000 MG CAPS, Take 1 capsule by mouth daily., Disp: , Rfl:    triamterene-hydrochlorothiazide (MAXZIDE) 75-50 MG tablet, Take 1 tablet by mouth daily., Disp: , Rfl:    alum & mag hydroxide-simeth (MAALOX MAX) 400-400-40 MG/5ML suspension, Take 5 mLs by mouth every 6 (six) hours as needed for indigestion. (Patient not taking: Reported on 06/06/2023), Disp: 355 mL, Rfl: 0   cephALEXin (KEFLEX) 500 MG capsule, Take 500 mg by mouth 4 (four) times daily. (Patient not taking: Reported on 06/06/2023), Disp: ,  Rfl:    ferrous sulfate 325 (65 FE) MG tablet, Take once or twice a day as tolerated (Patient not taking: Reported on 03/24/2015), Disp: 60 tablet, Rfl: 3   gabapentin (NEURONTIN) 300 MG capsule, Take 1 capsule (300 mg total) by mouth 2 (two) times daily. Take just 1 pill the first day of treatment (Patient not taking: Reported on 12/20/2021), Disp: 90 capsule, Rfl: 3   methocarbamol (ROBAXIN) 500 MG tablet, Take 1 tablet (500 mg total) by mouth every 8 (eight) hours as needed for muscle spasms. (Patient not taking: Reported on 06/06/2023), Disp: 8 tablet, Rfl: 0   potassium chloride SA (KLOR-CON M) 20 MEQ tablet, Take 3 tablets (60 mEq total) by mouth once for 1 dose., Disp: 3 tablet, Rfl: 0   triamterene-hydrochlorothiazide (MAXZIDE-25) 37.5-25 MG tablet, Take 1 tablet by mouth daily. (Patient not taking: Reported on 12/20/2021), Disp: 30 tablet, Rfl: 2  PAST MEDICAL HISTORY: Past Medical History:  Diagnosis Date   Allergy  Arthritis    Gall stones    Gallstones    Hypertension    Ulcer     PAST SURGICAL HISTORY: Past Surgical History:  Procedure Laterality Date   TUBAL LIGATION  31 y ago    FAMILY HISTORY: Family History  Problem Relation Age of Onset   Heart disease Mother    Hypertension Mother    Stroke Mother    Hypertension Father    Cancer Father    Hypertension Sister    Diabetes Brother    Cancer Maternal Grandmother    Mental illness Maternal Grandmother    Stroke Maternal Grandmother    Heart disease Maternal Grandfather    Cancer Paternal Grandmother    Cancer Paternal Grandfather    Diabetes Brother     SOCIAL HISTORY: Social History   Socioeconomic History   Marital status: Married    Spouse name: Not on file   Number of children: Not on file   Years of education: Not on file   Highest education level: Not on file  Occupational History   Not on file  Tobacco Use   Smoking status: Never   Smokeless tobacco: Not on file  Substance and Sexual Activity    Alcohol use: No   Drug use: No   Sexual activity: Not on file  Other Topics Concern   Not on file  Social History Narrative   Not on file   Social Drivers of Health   Financial Resource Strain: Not on file  Food Insecurity: Low Risk  (11/09/2022)   Received from [facility]   Hunger Vital Sign    Worried About Running Out of Food in the Last Year: Never true    Ran Out of Food in the Last Year: Never true  Transportation Needs: Not on file (11/09/2022)  Physical Activity: Not on file  Stress: Not on file  Social Connections: Not on file  Intimate Partner Violence: Not on file       PHYSICAL EXAM  Vitals:   06/06/23 1032  BP: 128/87  Pulse: (!) 105  Weight: 284 lb 8 oz (129 kg)  Height: 5\' 4"  (1.626 m)    Body mass index is 48.83 kg/m.    General: The patient is well-developed and well-nourished and in no acute distress  HEENT:  Head is [place]/AT.  Sclera are anicteric.  Funduscopic exam shows normal optic discs and retinal vessels.  Neck: No carotid bruits are noted.  The neck is nontender.  Cardiovascular: The heart has a regular rate and rhythm with a normal S1 and S2. There were no murmurs, gallops or rubs.    Skin: Extremities are without rash or  edema.  Musculoskeletal:  Back is nontender  Neurologic Exam  Mental status: The patient is alert and oriented x 3 at the time of the examination. The patient has apparent normal recent and remote memory, with an apparently normal attention span and concentration ability.   Speech is normal.  Cranial nerves: Extraocular movements are full. Pupils are equal, round, and reactive to light and accomodation. Normal color vision and symm acuity.  Facial symmetry is present. There is good facial sensation to soft touch bilaterally.Facial strength is normal.  Trapezius and sternocleidomastoid strength is normal. No dysarthria is noted.  The tongue is midline, and the patient has symmetric elevation of the soft palate. No  obvious hearing deficits are noted.  Motor:  Muscle bulk is normal.   Tone is normal. Strength is  5 / 5  in all 4 extremities.   Sensory: She reported slight asymmetry to vibration sensation in the hands, left on the left but sensation to pinprick was more symmetric.  Sensation was normal in the legs  Coordination: Cerebellar testing reveals good finger-nose-finger and heel-to-shin bilaterally.  Gait and station: Station is normal.   Gait is normal. Tandem gait is mildly wide. Romberg is negative.   Reflexes: Deep tendon reflexes are symmetric and normal bilaterally.   Plantar responses are flexor.    DIAGNOSTIC DATA (LABS, IMAGING, TESTING) - I reviewed patient records, labs, notes, testing and imaging myself where available.  Lab Results  Component Value Date   WBC 7.0 12/20/2021   HGB 11.8 (L) 12/20/2021   HCT 37.3 12/20/2021   MCV 82.5 12/20/2021   PLT 276 12/20/2021      Component Value Date/Time   NA 138 12/20/2021 1138   K 3.3 (L) 12/20/2021 1138   CL 104 12/20/2021 1138   CO2 25 12/20/2021 1138   GLUCOSE 90 12/20/2021 1138   BUN 18 12/20/2021 1138   CREATININE 1.25 (H) 12/20/2021 1138   CREATININE 1.25 (H) 11/26/2014 1631   CALCIUM 9.3 12/20/2021 1138   PROT 7.6 12/20/2021 1138   ALBUMIN 3.9 12/20/2021 1138   AST 24 12/20/2021 1138   ALT 15 12/20/2021 1138   ALKPHOS 73 12/20/2021 1138   BILITOT 0.8 12/20/2021 1138   GFRNONAA 48 (L) 12/20/2021 1138   GFRAA >60 05/14/2018 1212   Lab Results  Component Value Date   CHOL 210 (H) 02/17/2015   HDL 79 02/17/2015   LDLCALC 121 02/17/2015   TRIG 52 02/17/2015   CHOLHDL 2.7 02/17/2015   No results found for: "HGBA1C"    ASSESSMENT AND PLAN  Demyelinating disease (HCC) - Plan: Anti-MOG, Serum, Neuromyelitis optica autoab, IgG, ANCA Profile, MR BRAIN W WO CONTRAST, Vitamin B12, Copper, serum  Transverse myelitis (HCC) - Plan: Vitamin B12, Copper, serum  Dysesthesia - Plan: Vitamin B12, Copper, serum   In  summary, Ms. Kaczanowski is a 68 year old woman who had the onset of dysesthesias, predominantly on the left, last year and was noted on cervical spine MRI to have an abnormal focus adjacent to C1-C2 that could be consistent with a focus of demyelination.  No other foci were noted in the spinal cord or the posterior fossa of the brain.  We need to check an MRI of the brain to determine if there or additional foci within the parents consistent with demyelination to determine if she has multiple sclerosis.  This could also be a postviral or idiopathic transverse myelitis, in which case this might be the sequela of a single episode.  We also need to check autoimmune and metabolic lab worik that could explain a demyelinating or other focus in the spinal cord.  For her dysesthetic pain I started lamotrigine and she will titrate this up to 50 mg twice a day.  Based on the results of the blood work, further evaluation or treatment may be needed.  She will return to see me in 5 to 6 months or sooner if there are significant new neurologic symptoms.  Thank you for asking me to see Ms. Yoak.  Please let me know if I can be of further assistance with her or other patients in the future.   This visit is part of a comprehensive longitudinal care medical relationship regarding the patients primary diagnosis of demyelinating disease and related concerns.   Ravin Bendall A. Epimenio Foot, MD, [facility] 06/06/2023, 5:42 PM Certified  in Neurology, Clinical Neurophysiology, Sleep Medicine and Neuroimaging  [facility] [address] [place], [place] [address] [phone]

## 2023-06-06 ENCOUNTER — Encounter: Payer: Self-pay | Admitting: Neurology

## 2023-06-06 ENCOUNTER — Ambulatory Visit: Payer: PPO | Admitting: Neurology

## 2023-06-06 VITALS — BP 128/87 | HR 105 | Ht 64.0 in | Wt 284.5 lb

## 2023-06-06 DIAGNOSIS — G379 Demyelinating disease of central nervous system, unspecified: Secondary | ICD-10-CM

## 2023-06-06 DIAGNOSIS — R208 Other disturbances of skin sensation: Secondary | ICD-10-CM | POA: Diagnosis not present

## 2023-06-06 DIAGNOSIS — G373 Acute transverse myelitis in demyelinating disease of central nervous system: Secondary | ICD-10-CM

## 2023-06-06 MED ORDER — LAMOTRIGINE 25 MG PO TABS: ORAL_TABLET | ORAL | 5 refills | Status: AC

## 2023-06-06 NOTE — Patient Instructions (Signed)
The pharmacy has the prescription of lamotrigine 25 mg. Please take it as follows: For 5 days take 1 pill once a day. The next 5 days, take 1 pill twice a day. The next 5 days, take 1 pill in the morning and 2 at bedtime or evening. The n take 2 pills twice a day.  Most people tolerate lamotrigine very well. Some will get a rash.  If you do get a significant rash stop the medicine immediately and let us know.  

## 2023-06-07 ENCOUNTER — Telehealth: Payer: Self-pay | Admitting: Neurology

## 2023-06-07 NOTE — Telephone Encounter (Signed)
healthteam no auth required sent to GI [phone]

## 2023-06-09 ENCOUNTER — Encounter: Payer: Self-pay | Admitting: Neurology

## 2023-06-09 LAB — ANCA PROFILE
Anti-MPO Antibodies: <0.2 U (ref 0.0–0.9)
Anti-PR3 Antibodies: <0.2 U (ref 0.0–0.9)
Atypical pANCA: <1:20 {titer}
C-ANCA: <1:20 {titer}
P-ANCA: <1:20 {titer}

## 2023-06-09 LAB — COPPER, SERUM: Copper: 154 ug/dL (ref 80–158)

## 2023-06-09 LAB — NEUROMYELITIS OPTICA AUTOAB, IGG

## 2023-06-09 LAB — ANTI-MOG, SERUM: MOG Antibody, Cell-based IFA: NEGATIVE

## 2023-06-09 LAB — VITAMIN B12: Vitamin B-12: >2000 pg/mL — ABNORMAL HIGH (ref 232–1245)

## 2023-06-21 ENCOUNTER — Telehealth: Payer: Self-pay | Admitting: Neurology

## 2023-06-21 DIAGNOSIS — G379 Demyelinating disease of central nervous system, unspecified: Secondary | ICD-10-CM

## 2023-06-21 NOTE — Telephone Encounter (Signed)
 Contacted pt back, contrast dye is on her allergy list. Will discuss with Dr Vear if he wants to do MRI without. Also informed her that her labs were ok. Dr Vear sent them via [facility] She stated she does not use and hasn't been active since 2023. She requested deactivation.

## 2023-06-21 NOTE — Telephone Encounter (Signed)
 I signed the order to do the noncontrasted MRI.  I see she was asking about her blood work.  The lab work was normal.

## 2023-06-21 NOTE — Telephone Encounter (Signed)
 Pt states she is allergic to dye, she is asking for a call to discuss her scheduled MRI being with contrast.  Also pt is asking for a call with the results to her blood work.

## 2023-06-21 NOTE — Telephone Encounter (Signed)
 Pt is aware.

## 2023-06-26 NOTE — Telephone Encounter (Signed)
New order sent to [facility]

## 2023-06-29 ENCOUNTER — Other Ambulatory Visit: Payer: PPO

## 2023-07-19 ENCOUNTER — Ambulatory Visit
Admission: RE | Admit: 2023-07-19 | Discharge: 2023-07-19 | Disposition: A | Discharge status: Home / Self Care | Payer: PPO | Source: Ambulatory Visit | Attending: Neurology | Admitting: Neurology

## 2023-07-19 DIAGNOSIS — G379 Demyelinating disease of central nervous system, unspecified: Secondary | ICD-10-CM | POA: Diagnosis not present

## 2023-07-23 ENCOUNTER — Telehealth: Payer: Self-pay

## 2023-07-23 NOTE — Telephone Encounter (Signed)
-----   Message from Asa Lente sent at 07/20/2023  8:14 AM EST ----- Please let her know that the MRI of the brain looked good.  It did not show any evidence of MS or stroke

## 2023-12-25 ENCOUNTER — Ambulatory Visit: Payer: PPO | Admitting: Neurology

## 2023-12-25 ENCOUNTER — Encounter: Payer: Self-pay | Admitting: Neurology

## 2023-12-25 VITALS — BP 132/86 | HR 76 | Ht 64.0 in | Wt 270.5 lb

## 2023-12-25 DIAGNOSIS — R208 Other disturbances of skin sensation: Secondary | ICD-10-CM | POA: Diagnosis not present

## 2023-12-25 DIAGNOSIS — G373 Acute transverse myelitis in demyelinating disease of central nervous system: Secondary | ICD-10-CM | POA: Diagnosis not present

## 2023-12-25 DIAGNOSIS — G379 Demyelinating disease of central nervous system, unspecified: Secondary | ICD-10-CM | POA: Diagnosis not present

## 2023-12-25 MED ORDER — IMIPRAMINE HCL 25 MG PO TABS: 25.0000 mg | ORAL_TABLET | Freq: Every day | ORAL | 3 refills | Status: AC

## 2023-12-25 NOTE — Progress Notes (Signed)
 [facility]  PATIENT: Jenna Ashley DOB: [birth year]  REFERRING DOCTOR OR PCP:  Oneil Priestly, MD; Alm Mower, MD SOURCE: Patient, notes from orthopedics, imaging and lab reports, MRI images personally reviewed.  _________________________________   HISTORICAL  CHIEF COMPLAINT:  Chief Complaint  Patient presents with   Follow-up    Pt in room 10. Alone. Here for Demyelinating disease. Patient states bilateral leg pain, typically when raining or overworked.     HISTORY OF PRESENT ILLNESS:   Jenna Ashley is a 68 y.o. woman with dysesthesias and abnormal cervical spine MRI.   UPDATE 12/25/2023: She is reporting more dysesthesias.    She notes this mostly in her hands, left > right.    This involves the entire hand and all fingers, but the 2-4 fingers hurt the most.     Since the last visit we checked a brain MRI that was normal.  Lamotrigine  was started for dysesthesias but did not help.    Gabapentin  was poorly tolerated in the past  She has urinary frequency/urgency that worsened some with the TM in Sept 2024.   She has occasional urinary incontinence  She denies visual changes.   She is correctable to excellent vision in both eyes  She has poor sleep and gets 4-5 hours most nights - more trouble with maintenance than onset.     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    Due to symptoms, she had an MRI of the cervical spine showing a focus at C1C2.    Compared to the first week of symptoms, she has more pain in the left arm but continued numbness on the left from shoulder to leg.  Pain is sharp.   She reports some other times in the past when she noted left sided numbness and pain but never as bad as it has been since November.  She was placed on gabapentin  300 mg po bid but did not note a benefit so stopped.   Methocarbamol  had not  helped.   Of note, she thinks she had Covid 2-3 weeks before her symptoms (not in Epic)  Imaging: MRI of the cervical spine 02/02/2023 shows a T2/STIR hyperintense focus within the spinal cord, possibly within the posterior columns adjacent to C1-C2.  Unfortunately, axial images did not cover this region.  The study was done without contrast.  Elsewhere in the spine, there are degenerative changes at C4-C5 causing moderate right greater than left foraminal narrowing and moderate degenerative changes at C5-C6 causing mild foraminal narrowing.  No definite nerve root compression.  MRI of the lumbar spine 02/02/2023 showed reduced disc height and endplate degenerative changes at T11-T12 and T12-L1.  No spinal stenosis or nerve root compression at these levels.  Stenosis at due to disc bulging, facet hypertrophy and increased epidural fat.  There is mild lateral recess stenosis but no nerve root compression.,  Facet hypertrophy and increased epidural fat causing mild foraminal narrowing and minimal lateral recess stenosis but no nerve root compression.  REVIEW OF SYSTEMS: Constitutional: No fevers, chills, sweats, or change in appetite Eyes: No visual changes, double vision, eye pain Ear, nose and throat: No hearing loss, ear pain, nasal congestion, sore throat Cardiovascular: No chest pain, palpitations Respiratory:  No shortness of breath at rest or with exertion.   No wheezes GastrointestinaI: No nausea, vomiting, diarrhea, abdominal pain, fecal incontinence  Genitourinary:  No dysuria, urinary retention or frequency.  No nocturia. Musculoskeletal:  No neck pain, back pain Integumentary: No rash, pruritus, skin lesions Neurological: as above Psychiatric: No depression at this time.  No anxiety Endocrine: No palpitations, diaphoresis, change in appetite, change in weigh or increased thirst Hematologic/Lymphatic:  No anemia, purpura, petechiae. Allergic/Immunologic: No itchy/runny eyes, nasal  congestion, recent allergic reactions, rashes  ALLERGIES: Allergies  Allergen Reactions   Ivp Dye [Iodinated Contrast Media] Anaphylaxis   Elemental Sulfur Itching   Sulfa Antibiotics Itching    HOME MEDICATIONS:  Current Outpatient Medications:    albuterol  (VENTOLIN  HFA) 108 (90 Base) MCG/ACT inhaler, Inhale 2 puffs into the lungs every 6 (six) hours as needed for wheezing or shortness of breath., Disp: , Rfl:    ascorbic acid ([employer] VITAMIN C) 500 MG tablet, Take 500 mg by mouth daily., Disp: , Rfl:    cholecalciferol (VITAMIN D3) 25 MCG (1000 UNIT) tablet, Take 1,000 Units by mouth daily., Disp: , Rfl:    Fluticasone-Salmeterol (ADVAIR) 100-50 MCG/DOSE AEPB, Inhale 1 puff into the lungs every 12 (twelve) hours., Disp: , Rfl:    imipramine  (TOFRANIL ) 25 MG tablet, Take 1 tablet (25 mg total) by mouth at bedtime., Disp: 90 tablet, Rfl: 3   lamoTRIgine  (LAMICTAL ) 25 MG tablet, One po every day x 5 days, then one po bid x 5 d, then one po qAM and two po qPM x 5 d, then 2 po bid, Disp: 120 tablet, Rfl: 5   naproxen sodium (ALEVE) 220 MG tablet, Take 220 mg by mouth daily as needed (pain)., Disp: , Rfl:    Omega-3 Fatty Acids (FISH OIL) 1000 MG CAPS, Take 1 capsule by mouth daily., Disp: , Rfl:    triamterene -hydrochlorothiazide (MAXZIDE) 75-50 MG tablet, Take 1 tablet by mouth daily., Disp: , Rfl:    hydrOXYzine (ATARAX) 25 MG tablet, Take 25 mg by mouth every 6 (six) hours as needed for itching., Disp: , Rfl:    potassium chloride  SA (KLOR-CON  M) 20 MEQ tablet, Take 3 tablets (60 mEq total) by mouth once for 1 dose. (Patient not taking: Reported on 12/25/2023), Disp: 3 tablet, Rfl: 0  PAST MEDICAL HISTORY: Past Medical History:  Diagnosis Date   Allergy    Arthritis    Gall stones    Gallstones    Hypertension    Ulcer     PAST SURGICAL HISTORY: Past Surgical History:  Procedure Laterality Date   TUBAL LIGATION  66 y ago    FAMILY HISTORY: Family History  Problem Relation Age  of Onset   Heart disease Mother    Hypertension Mother    Stroke Mother    Hypertension Father    Cancer Father    Hypertension Sister    Diabetes Brother    Cancer Maternal Grandmother    Mental illness Maternal Grandmother    Stroke Maternal Grandmother    Heart disease Maternal Grandfather    Cancer Paternal Grandmother    Cancer Paternal Grandfather    Diabetes Brother     SOCIAL HISTORY: Social History   Socioeconomic History   Marital status: Married    Spouse name: Not on file   Number of children: Not on file   Years of education: Not on file   Highest education level: Not on file  Occupational History   Not on file  Tobacco Use   Smoking status: Never   Smokeless tobacco: Not on file  Vaping Use   Vaping status: Never Used  Substance and Sexual Activity   Alcohol use: No   Drug use: No   Sexual activity: Not on file  Other Topics Concern   Not on file  Social History Narrative   Not on file   Social Drivers of Health   Financial Resource Strain: Not on file  Food Insecurity: Low Risk  (11/09/2023)   Received from [facility]   Hunger Vital Sign    Within the past 12 months, you worried that your food would run out before you got money to buy more: Never true    Within the past 12 months, the food you bought just didn't last and you didn't have money to get more. : Never true  Transportation Needs: No Transportation Needs (11/09/2023)   Received from [facility]    In the past 12 months, has lack of reliable transportation kept you from medical appointments, meetings, work or from getting things needed for daily living? : No  Physical Activity: Not on file  Stress: Not on file  Social Connections: Not on file  Intimate Partner Violence: Not on file       PHYSICAL EXAM  Vitals:   12/25/23 1050  BP: 132/86  Pulse: 76  Weight: 270 lb 8 oz (122.7 kg)  Height: 5' 4 (1.626 m)    Body mass index is 46.43  kg/m.    General: The patient is well-developed and well-nourished and in no acute distress  HEENT:  Head is [place]/AT.  Sclera are anicteric.     Skin: Extremities are without rash or  edema.    Neurologic Exam  Mental status: The patient is alert and oriented x 3 at the time of the examination. The patient has apparent normal recent and remote memory, with an apparently normal attention span and concentration ability.   Speech is normal.  Cranial nerves: Extraocular movements are  .Facial strength is normal.  Trapezius and sternocleidomastoid strength is normal. No dysarthria is noted.  The tongue is midline, and the patient has symmetric elevation of the soft palate. No obvious hearing deficits are noted.  Motor:  Muscle bulk is normal.   Tone is normal. Strength is  5 / 5 in all 4 extremities.   Sensory: She reported reduced temperature and touch in the right digits1-3 and adjacent hand (dorsum and ventral).  Vibatration was more symmetric.     Coordination: Cerebellar testing reveals good finger-nose-finger and heel-to-shin bilaterally.  Gait and station: Station is normal.   Gait is normal. Tandem gait is mildly wide.  The Romberg was negative.  Reflexes: Deep tendon reflexes are symmetric and normal bilaterally.   Plantar responses are flexor.    DIAGNOSTIC DATA (LABS, IMAGING, TESTING) - I reviewed patient records, labs, notes, testing and imaging myself where available.  Lab Results  Component Value Date   WBC 7.0 12/20/2021   HGB 11.8 (L) 12/20/2021   HCT 37.3 12/20/2021   MCV 82.5 12/20/2021   PLT 276 12/20/2021      Component Value Date/Time   NA 138 12/20/2021 1138   K 3.3 (L) 12/20/2021 1138   CL 104 12/20/2021 1138   CO2 25 12/20/2021 1138   GLUCOSE 90 12/20/2021 1138   BUN 18 12/20/2021 1138   CREATININE 1.25 (H) 12/20/2021 1138   CREATININE 1.25 (H) 11/26/2014 1631   CALCIUM 9.3 12/20/2021 1138   PROT 7.6 12/20/2021 1138   ALBUMIN 3.9 12/20/2021 1138    AST 24 12/20/2021 1138   ALT 15  12/20/2021 1138   ALKPHOS 73 12/20/2021 1138   BILITOT 0.8 12/20/2021 1138   GFRNONAA 48 (L) 12/20/2021 1138   GFRAA >60 05/14/2018 1212   Lab Results  Component Value Date   CHOL 210 (H) 02/17/2015   HDL 79 02/17/2015   LDLCALC 121 02/17/2015   TRIG 52 02/17/2015   CHOLHDL 2.7 02/17/2015   No results found for: HGBA1C    ASSESSMENT AND PLAN  Demyelinating disease (HCC)  Transverse myelitis (HCC)  Dysesthesia   She is still experiencing dysesthesias from the transverse myelitis.   She was unable to tolerate gabapentin  and lamotrigine  did not help.  I will have her try imipramine  Imipramine  25 mg p.o. nightly to help dysesthesias and also help sleep and bladder at night.   Consider oxybutyni or other bladder medication if urgency worsens.  Depending on response and tolerability consider increasing to 50 mg Rtc one year   This visit is part of a comprehensive longitudinal care medical relationship regarding the patients primary diagnosis of demyelinating disease and related concerns.   Buell Parcel A. Vear, MD, [facility] 12/25/2023, 12:09 PM Certified in Neurology, Clinical Neurophysiology, Sleep Medicine and Neuroimaging  [facility] [address] [place], [place] [address] [phone]

## 2024-03-26 ENCOUNTER — Ambulatory Visit: Admission: EM | Admit: 2024-03-26 | Discharge: 2024-03-26 | Disposition: A | Discharge status: Home / Self Care

## 2024-03-26 ENCOUNTER — Encounter: Payer: Self-pay | Admitting: Emergency Medicine

## 2024-03-26 DIAGNOSIS — H8112 Benign paroxysmal vertigo, left ear: Secondary | ICD-10-CM | POA: Insufficient documentation

## 2024-03-26 DIAGNOSIS — R350 Frequency of micturition: Secondary | ICD-10-CM | POA: Insufficient documentation

## 2024-03-26 LAB — POCT URINE DIPSTICK
Bilirubin, UA: NEGATIVE
Glucose, UA: NEGATIVE mg/dL
Ketones, POC UA: NEGATIVE mg/dL
Leukocytes, UA: NEGATIVE
Nitrite, UA: NEGATIVE
Protein Ur, POC: NEGATIVE mg/dL
Spec Grav, UA: 1.02 (ref 1.010–1.025)
Urobilinogen, UA: 0.2 U/dL
pH, UA: 7 (ref 5.0–8.0)

## 2024-03-26 MED ORDER — MECLIZINE HCL 25 MG PO TABS
25.0000 mg | ORAL_TABLET | Freq: Three times a day (TID) | ORAL | 0 refills | Status: AC | PRN
Start: 2024-03-26 — End: ?

## 2024-03-26 NOTE — Discharge Instructions (Addendum)
  1. Benign positional vertigo, left (Primary) - meclizine  (ANTIVERT ) 25 MG tablet; Take 1 tablet (25 mg total) by mouth 3 (three) times daily as needed for dizziness.  Dispense: 30 tablet; Refill: 0  2. Urinary frequency - POCT URINE DIPSTICK completed in UC shows trace blood, no leukocytes, no nitrite, no significant sign of urinary infection as cause of increased urinary frequency - Urine Culture collected in UC and sent to lab for further testing results should be available in 2 to 3 days. -Continue to monitor symptoms for any change in severity if there is any escalation of current symptoms or development of new symptoms follow-up in ER for further evaluation and management.

## 2024-03-26 NOTE — ED Triage Notes (Signed)
 Pt states Nov 3rd she began feeling lightheaded at work and saw nurse who referred her to ENT scoped nose and didn't find anything.  She has episodes of dizziness when moving, sinus pressure,and ear popping Also has been urinating more often for about 1 week. States she is also drinking more water  Covid test at PCP last week was negative. She also had blood drawn.  She is currently taking abx

## 2024-03-26 NOTE — ED Provider Notes (Signed)
 [facility]  Note:  This document was prepared using Dragon voice recognition software and may include unintentional dictation errors.  MRN: [id number] DOB: [birth year]  Subjective:   Jenna Ashley is a 68 y.o. female presenting for intermittent lightheadedness off-and-on since November 3.  Patient reports that she was recently seen by nurse at her work for symptoms and was evaluated and referred to ENT.  Otolaryngology performed a endoscopy of the sinuses stating there was no sign of any infection.  Patient was started on prednisone which seemed to help symptoms slightly.  Patient is still having intermittent dizziness/lightheadedness first thing in the morning.  Patient called her primary care provider who prescribed antibiotic therapy that she is currently taking.  Patient denies any improvement to symptoms.  Patient also reports that she is having increased urinary frequency x 1 week.  Patient does admit to drinking more water and thought that that might be the cause but wants to make sure there is no urinary infection.  Patient reports negative COVID test at PCP last week.  No shortness of breath, chest pain, weakness, dizziness at this time.  No current facility-administered medications for this encounter.  Current Outpatient Medications:    meclizine  (ANTIVERT ) 25 MG tablet, Take 1 tablet (25 mg total) by mouth 3 (three) times daily as needed for dizziness., Disp: 30 tablet, Rfl: 0   albuterol  (VENTOLIN  HFA) 108 (90 Base) MCG/ACT inhaler, Inhale 2 puffs into the lungs every 6 (six) hours as needed for wheezing or shortness of breath., Disp: , Rfl:    cholecalciferol (VITAMIN D3) 25 MCG (1000 UNIT) tablet, Take 1,000 Units by mouth daily., Disp: , Rfl:    Fluticasone-Salmeterol (ADVAIR) 100-50 MCG/DOSE AEPB, Inhale 1 puff into the lungs every 12 (twelve) hours., Disp: , Rfl:    hydrOXYzine (ATARAX) 25 MG tablet, Take 25 mg by mouth every 6 (six) hours as needed for  itching., Disp: , Rfl:    imipramine  (TOFRANIL ) 25 MG tablet, Take 1 tablet (25 mg total) by mouth at bedtime., Disp: 90 tablet, Rfl: 3   lamoTRIgine  (LAMICTAL ) 25 MG tablet, One po every day x 5 days, then one po bid x 5 d, then one po qAM and two po qPM x 5 d, then 2 po bid, Disp: 120 tablet, Rfl: 5   naproxen sodium (ALEVE) 220 MG tablet, Take 220 mg by mouth daily as needed (pain)., Disp: , Rfl:    Omega-3 Fatty Acids (FISH OIL) 1000 MG CAPS, Take 1 capsule by mouth daily., Disp: , Rfl:    triamterene -hydrochlorothiazide (MAXZIDE) 75-50 MG tablet, Take 1 tablet by mouth daily., Disp: , Rfl:    Allergies  Allergen Reactions   Ivp Dye [Iodinated Contrast Media] Anaphylaxis   Elemental Sulfur Itching   Sulfa Antibiotics Itching    Past Medical History:  Diagnosis Date   Allergy    Arthritis    Gall stones    Gallstones    Hypertension    Ulcer      Past Surgical History:  Procedure Laterality Date   TUBAL LIGATION  1 y ago    Family History  Problem Relation Age of Onset   Heart disease Mother    Hypertension Mother    Stroke Mother    Hypertension Father    Cancer Father    Hypertension Sister    Diabetes Brother    Cancer Maternal Grandmother    Mental illness Maternal Grandmother    Stroke Maternal Grandmother    Heart  disease Maternal Grandfather    Cancer Paternal Grandmother    Cancer Paternal Grandfather    Diabetes Brother     Social History   Tobacco Use   Smoking status: Never  Vaping Use   Vaping status: Never Used  Substance Use Topics   Alcohol use: No   Drug use: No    ROS Refer to HPI for ROS details.  Objective:   Vitals: BP 134/88 (BP Location: Right Arm)   Pulse 75   Temp 97.7 F (36.5 C) (Oral)   Resp 17   SpO2 97%   Physical Exam Vitals and nursing note reviewed.  Constitutional:      General: She is not in acute distress.    Appearance: Normal appearance. She is well-developed. She is not ill-appearing or  toxic-appearing.  HENT:     Head: Normocephalic and atraumatic.     Right Ear: Ear canal and external ear normal. A middle ear effusion is present. Tympanic membrane is not injected, erythematous or bulging.     Left Ear: Ear canal and external ear normal. A middle ear effusion is present. Tympanic membrane is bulging. Tympanic membrane is not injected or erythematous.     Nose: Rhinorrhea present. No congestion.     Mouth/Throat:     Mouth: Mucous membranes are moist.     Pharynx: Oropharynx is clear.  Cardiovascular:     Rate and Rhythm: Normal rate.  Pulmonary:     Effort: Pulmonary effort is normal. No respiratory distress.  Musculoskeletal:        General: Normal range of motion.  Skin:    General: Skin is warm and dry.  Neurological:     General: No focal deficit present.     Mental Status: She is alert and oriented to person, place, and time.  Psychiatric:        Mood and Affect: Mood normal.        Behavior: Behavior normal.     Procedures  Results for orders placed or performed during the hospital encounter of 03/26/24 (from the past 24 hours)  POCT URINE DIPSTICK     Status: Abnormal   Collection Time: 03/26/24  9:08 AM  Result Value Ref Range   Color, UA yellow yellow   Clarity, UA clear clear   Glucose, UA negative negative mg/dL   Bilirubin, UA negative negative   Ketones, POC UA negative negative mg/dL   Spec Grav, UA 8.979 8.989 - 1.025   Blood, UA trace-intact (A) negative   pH, UA 7.0 5.0 - 8.0   Protein Ur, POC negative negative mg/dL   Urobilinogen, UA 0.2 0.2 or 1.0 E.U./dL   Nitrite, UA Negative Negative   Leukocytes, UA Negative Negative    No results found.   Assessment and Plan :     Discharge Instructions       1. Benign positional vertigo, left (Primary) - meclizine  (ANTIVERT ) 25 MG tablet; Take 1 tablet (25 mg total) by mouth 3 (three) times daily as needed for dizziness.  Dispense: 30 tablet; Refill: 0  2. Urinary frequency -  POCT URINE DIPSTICK completed in UC shows trace blood, no leukocytes, no nitrite, no significant sign of urinary infection as cause of increased urinary frequency - Urine Culture collected in UC and sent to lab for further testing results should be available in 2 to 3 days. -Continue to monitor symptoms for any change in severity if there is any escalation of current symptoms or development of new symptoms  follow-up in ER for further evaluation and management.      Korry Dalgleish B Zainab Crumrine   Zakirah Weingart, [place], [place] 03/26/24 [phone]

## 2024-03-28 ENCOUNTER — Ambulatory Visit: Payer: Self-pay

## 2024-03-28 DIAGNOSIS — N3000 Acute cystitis without hematuria: Secondary | ICD-10-CM

## 2024-03-28 LAB — URINE CULTURE
Culture: >=100000 — AB
Special Requests: NORMAL

## 2024-03-28 MED ORDER — CEFUROXIME AXETIL 250 MG PO TABS
250.0000 mg | ORAL_TABLET | Freq: Two times a day (BID) | ORAL | 0 refills | Status: AC
Start: 2024-03-28 — End: 2024-04-04

## 2024-03-28 NOTE — Telephone Encounter (Signed)
 Urine culture reveals 100,000 Klebsiella, these findings are strongly suggestive of urinary tract infection.  Prescribed antibiotics cefuroxime 250 mg twice daily for 7 days to treat Klebsiella cystitis.

## 2024-05-21 ENCOUNTER — Telehealth: Payer: Self-pay | Admitting: Neurology

## 2024-05-21 NOTE — Telephone Encounter (Signed)
 Pt is asking for a call from RN to discuss the pain in her L Hand, off an on pain for about 2 months , pain more intense, can't sleep at night, please call to discuss.

## 2024-05-22 NOTE — Telephone Encounter (Signed)
 Patient returned phone call, patient said please call back after 3 pm

## 2024-05-22 NOTE — Telephone Encounter (Signed)
I called pt and LMVM to return call.  

## 2024-05-22 NOTE — Telephone Encounter (Signed)
 I called pt c/o finger/ hand pain (worse L then R) progressive worsening. Feels nerve pain,  worse at night.  Has also neck tension is back, legs hurting Sun thru Tues/ cramps.  Imipramine  not really helping.  She will be calling her pcp tomorrow.  I relayed will forward message and if feels that he needs to address will let her know.   She was ok with this plan.  Appreciated call back.

## 2024-05-29 NOTE — Telephone Encounter (Signed)
 I called pt and she said her pain is no worse but no better.  I made appt for her 06-16-2024 at 2pm.  She appreciated follow up.

## 2024-06-16 ENCOUNTER — Ambulatory Visit: Admitting: Neurology

## 2024-12-30 ENCOUNTER — Ambulatory Visit: Admitting: Neurology
